# Patient Record
Sex: Female | Born: 1950 | Race: Black or African American | Hispanic: No | Smoking: Current every day smoker
Health system: Southern US, Community
[De-identification: ages and names within clinical notes are randomized; demographics above are authoritative.]

## PROBLEM LIST (undated history)

## (undated) DIAGNOSIS — Z720 Tobacco use: Secondary | ICD-10-CM

## (undated) DIAGNOSIS — I10 Essential (primary) hypertension: Secondary | ICD-10-CM

## (undated) DIAGNOSIS — F329 Major depressive disorder, single episode, unspecified: Secondary | ICD-10-CM

## (undated) DIAGNOSIS — F32A Depression, unspecified: Secondary | ICD-10-CM

## (undated) DIAGNOSIS — K643 Fourth degree hemorrhoids: Secondary | ICD-10-CM

## (undated) DIAGNOSIS — J449 Chronic obstructive pulmonary disease, unspecified: Secondary | ICD-10-CM

## (undated) DIAGNOSIS — M1990 Unspecified osteoarthritis, unspecified site: Secondary | ICD-10-CM

## (undated) DIAGNOSIS — R0600 Dyspnea, unspecified: Secondary | ICD-10-CM

## (undated) HISTORY — PX: EYE SURGERY: SHX253

## (undated) HISTORY — DX: Fourth degree hemorrhoids: K64.3

## (undated) HISTORY — DX: Tobacco use: Z72.0

## (undated) HISTORY — PX: BREAST SURGERY: SHX581

## (undated) HISTORY — PX: TUBAL LIGATION: SHX77

---

## 1999-01-23 HISTORY — PX: BACK SURGERY: SHX140

## 2013-01-22 HISTORY — PX: HEMORRHOID SURGERY: SHX153

## 2016-01-23 HISTORY — PX: OTHER SURGICAL HISTORY: SHX169

## 2017-03-18 ENCOUNTER — Ambulatory Visit: Payer: Self-pay | Admitting: Surgery

## 2017-03-18 ENCOUNTER — Encounter: Payer: Self-pay | Admitting: Surgery

## 2017-03-18 NOTE — H&P (Signed)
Kirsten Cabrera Documented: 03/18/2017 2:29 PM Location: [facility] Patient #: [id number] DOB: 08/22/1950 Widowed / Language: Cleophus Molt / Race: Black or African American Female   History of Present Illness Kirsten Hector MD; 03/18/2017 3:04 PM) The patient is a 67 year old female who presents with hemorrhoids. Note for "Hemorrhoids": ` ` ` Patient sent for surgical consultation at the request of Dr. Harlan Stains  Chief Complaint: Worsening hemorrhoids.  The patient is a woman that has had hemorrhoid surgery in the past is occasionally struggled with anal pain presumably hemorrhoids. Relocated from [facility]. Has struggled with some hemorrhoid issues for the past decade. Recalls having some type of hemorrhoid surgery in 2013. She notes that she gets intermittent bleeding and feels a lump around her anus when she wipes. Has been trying to manage it with baking soda soaks. No sharp pains but irritating when she wipes. Annoying and frustrating. She wish to have something done. Surgical consultation requested. She usually moves her bowels twice a day. Some mild lactose intolerance. She's never had a colonoscopy. She claims her fecal occult bloods were normal back in [place]. She smokes a few cigarettes a day but not heavy smoker. She had her tubes tied but no other abdominal surgeries. She does get short of breath after walking about 15-20 minutes but no exertional chest pain. No history of prior heart attack or stroke.  No personal nor family history of GI/colon cancer, inflammatory bowel disease, irritable bowel syndrome, allergy such as Celiac Sprue, dietary/dairy problems, colitis, ulcers nor gastritis. No recent sick contacts/gastroenteritis. No travel outside the country. No changes in diet. No dysphagia to solids or liquids. No significant heartburn or reflux. No hematochezia, hematemesis, coffee ground emesis. No evidence of prior gastric/peptic  ulceration.  (Review of systems as stated in this history (HPI) or in the review of systems. Otherwise all other 12 point ROS are negative)   Past Surgical History Levonne Spiller, [place]; 03/18/2017 2:30 PM) Breast Biopsy  Right. multiple Hemorrhoidectomy  Spinal Surgery - Lower Back   Diagnostic Studies History Levonne Spiller, CMA; 03/18/2017 2:30 PM) Colonoscopy  never Mammogram  within last year Pap Smear  1-5 years ago  Allergies Levonne Spiller, [place]; 03/18/2017 2:30 PM) Penicillins  Allergies Reconciled   Medication History Levonne Spiller, CMA; 03/18/2017 2:33 PM) Albuterol Sulfate ((2.5 MG/3ML)0.083% Nebulized Soln, Inhalation) Active. AmLODIPine Besylate (5MG  Tablet, Oral) Active. Azopt (1% Suspension, Ophthalmic) Active. Hydrocodone-Acetaminophen (Oral) Specific strength unknown - Active. Hydroxychloroquine Sulfate (200MG  Tablet, Oral) Active. Symbicort (160-4.5MCG/ACT Aerosol, Inhalation) Active. Triamcinolone Acetonide (0.1% Cream, External) Active. Tylenol Extra Strength (500MG  Tablet, Oral) Active. Vitamin B-12 (1000MCG Tablet, Oral) Active. Medications Reconciled  Social History Andee Poles Education officer, museum, CMA; 03/18/2017 2:30 PM) Alcohol use  Occasional alcohol use. Caffeine use  Tea. No drug use  Tobacco use  Current some day smoker.  Family History Levonne Spiller, [place]; 03/18/2017 2:30 PM) Cerebrovascular Accident  Sister. Kidney Disease  Daughter.  Pregnancy / Birth History Levonne Spiller, [place]; 03/18/2017 2:30 PM) Age at menarche  50 years. Age of menopause  50-55 Contraceptive History  Oral contraceptives. Gravida  3 Para  3  Other Problems Levonne Spiller, CMA; 03/18/2017 2:30 PM) Arthritis  Back Pain  Chronic Obstructive Lung Disease  Hemorrhoids  High blood pressure     Review of Systems (Danielle Gerrigner CMA; 03/18/2017 2:30 PM) General Present- Night Sweats and Weight Gain. Not Present-  Appetite Loss, Chills, Fatigue, Fever and Weight Loss. Skin Present- Dryness. Not Present- Change in Wart/Mole, Hives, Jaundice,  New Lesions, Non-Healing Wounds, Rash and Ulcer. HEENT Present- Nose Bleed and Wears glasses/contact lenses. Not Present- Earache, Hearing Loss, Hoarseness, Oral Ulcers, Ringing in the Ears, Seasonal Allergies, Sinus Pain, Sore Throat, Visual Disturbances and Yellow Eyes. Breast Not Present- Breast Mass, Breast Pain, Nipple Discharge and Skin Changes. Cardiovascular Present- Leg Cramps, Shortness of Breath and Swelling of Extremities. Not Present- Chest Pain, Difficulty Breathing Lying Down, Palpitations and Rapid Heart Rate. Gastrointestinal Present- Bloating and Hemorrhoids. Not Present- Abdominal Pain, Bloody Stool, Change in Bowel Habits, Chronic diarrhea, Constipation, Difficulty Swallowing, Excessive gas, Gets full quickly at meals, Indigestion, Nausea, Rectal Pain and Vomiting. Musculoskeletal Present- Back Pain and Muscle Weakness. Not Present- Joint Pain, Joint Stiffness, Muscle Pain and Swelling of Extremities. Psychiatric Present- Change in Sleep Pattern. Not Present- Anxiety, Bipolar, Depression, Fearful and Frequent crying. Endocrine Present- Hair Changes and Hot flashes. Not Present- Cold Intolerance, Excessive Hunger, Heat Intolerance and New Diabetes. Hematology Not Present- Blood Thinners, Easy Bruising, Excessive bleeding, Gland problems, HIV and Persistent Infections.  Vitals Andee Poles Gerrigner CMA; 03/18/2017 2:33 PM) 03/18/2017 2:33 PM Weight: 184 lb Height: 62in Body Surface Area: 1.84 m Body Mass Index: 33.65 kg/m  Temp.: 98.62F(Oral)  Pulse: 103 (Regular)  BP: 162/90 (Sitting, Right Arm, Standard)       Physical Exam Kirsten Hector MD; 03/18/2017 2:46 PM) General Mental Status-Alert. General Appearance-Not in acute distress, Not Sickly. Orientation-Oriented X3. Hydration-Well  hydrated. Voice-Normal.  Integumentary Global Assessment Upon inspection and palpation of skin surfaces of the - Axillae: non-tender, no inflammation or ulceration, no drainage. and Distribution of scalp and body hair is normal. General Characteristics Temperature - normal warmth is noted.  Head and Neck Head-normocephalic, atraumatic with no lesions or palpable masses. Face Global Assessment - atraumatic, no absence of expression. Neck Global Assessment - no abnormal movements, no bruit auscultated on the right, no bruit auscultated on the left, no decreased range of motion, non-tender. Trachea-midline. Thyroid Gland Characteristics - non-tender.  Eye Eyeball - Left-Extraocular movements intact, No Nystagmus. Eyeball - Right-Extraocular movements intact, No Nystagmus. Cornea - Left-No Hazy. Cornea - Right-No Hazy. Sclera/Conjunctiva - Left-No scleral icterus, No Discharge. Sclera/Conjunctiva - Right-No scleral icterus, No Discharge. Pupil - Left-Direct reaction to light normal. Pupil - Right-Direct reaction to light normal.  ENMT Ears Pinna - Left - no drainage observed, no generalized tenderness observed. Right - no drainage observed, no generalized tenderness observed. Nose and Sinuses External Inspection of the Nose - no destructive lesion observed. Inspection of the nares - Left - quiet respiration. Right - quiet respiration. Mouth and Throat Lips - Upper Lip - no fissures observed, no pallor noted. Lower Lip - no fissures observed, no pallor noted. Nasopharynx - no discharge present. Oral Cavity/Oropharynx - Tongue - no dryness observed. Oral Mucosa - no cyanosis observed. Hypopharynx - no evidence of airway distress observed.  Chest and Lung Exam Inspection Movements - Normal and Symmetrical. Accessory muscles - No use of accessory muscles in breathing. Palpation Palpation of the chest reveals - Non-tender. Auscultation Breath sounds - Normal  and Clear.  Cardiovascular Auscultation Rhythm - Regular. Murmurs & Other Heart Sounds - Auscultation of the heart reveals - No Murmurs and No Systolic Clicks.  Abdomen Inspection Inspection of the abdomen reveals - No Visible peristalsis and No Abnormal pulsations. Umbilicus - No Bleeding, No Urine drainage. Palpation/Percussion Palpation and Percussion of the abdomen reveal - Soft, Non Tender, No Rebound tenderness, No Rigidity (guarding) and No Cutaneous hyperesthesia. Note: Abdomen soft. Not severely distended. No  distasis recti. No umbilical or other anterior abdominal wall hernias   Female Genitourinary Sexual Maturity Tanner 5 - Adult hair pattern. Note: No vaginal bleeding nor discharge   Rectal Note: ` ` ` Please refer to anoscopy section. Left posterior/lateral chronically prolapsed Grade 4 hemorrhoid. Sensitive.   Peripheral Vascular Upper Extremity Inspection - Left - No Cyanotic nailbeds, Not Ischemic. Right - No Cyanotic nailbeds, Not Ischemic.  Neurologic Neurologic evaluation reveals -normal attention span and ability to concentrate, able to name objects and repeat phrases. Appropriate fund of knowledge , normal sensation and normal coordination. Mental Status Affect - not angry, not paranoid. Cranial Nerves-Normal Bilaterally. Gait-Normal.  Neuropsychiatric Mental status exam performed with findings of-able to articulate well with normal speech/language, rate, volume and coherence, thought content normal with ability to perform basic computations and apply abstract reasoning and no evidence of hallucinations, delusions, obsessions or homicidal/suicidal ideation. Note: Fair recollection but no obvious dementia   Musculoskeletal Global Assessment Spine, Ribs and Pelvis - no instability, subluxation or laxity. Right Upper Extremity - no instability, subluxation or laxity.  Lymphatic Head & Neck  General Head & Neck Lymphatics: Bilateral -  Description - No Localized lymphadenopathy. Axillary  General Axillary Region: Bilateral - Description - No Localized lymphadenopathy. Femoral & Inguinal  Generalized Femoral & Inguinal Lymphatics: Left - Description - No Localized lymphadenopathy. Right - Description - No Localized lymphadenopathy.   Results Kirsten Hector MD; 03/18/2017 3:04 PM) Procedures  Name Value Date Hemorrhoids Procedure Anal exam: prolapse Internal exam: Internal Hemorroids ( non-bleeding) prolapse Other: Left prolapsed mucosal mass consistent with grade 4 left posterior lateral hemorrhoid. Sensitive. No thrombosis. No definite fissure or fistula. Anoscopy confirms grade 2 internal hemorrhoids............Marland KitchenPerianal skin clean with chronic moisture but no poor hygiene. No pruritis ani. No pilonidal disease. No fissure. No abscess/fistula. Normal sphincter tone. ....Marland KitchenMarland Kitcheno condyloma warts. Sensitive but tolerates digital and anoscopic rectal exam. No rectal masses.  Performed: 03/18/2017 2:51 PM    Assessment & Plan Kirsten Hector MD; 03/18/2017 2:51 PM) PROLAPSED INTERNAL HEMORRHOIDS, GRADE 4 (K64.3) Impression: Chronically prolapsed left posterolateral hemorrhoid. Sensitive. Chronic irritation. Doubt fistula with hypertrophy.  This will not go away without surgery. She wishes to consider this but definitely wants to be under general anesthesia. I don't blame her.  The anatomy & physiology of the anorectal region was discussed. The pathophysiology of hemorrhoids and differential diagnosis was discussed. Natural history progression was discussed. I stressed the importance of a bowel regimen to have daily soft bowel movements to minimize progression of disease. Goal of one BM / day ideal. Use of wet wipes, warm baths, avoiding straining, etc were emphasized.  Educational handouts further explaining the pathology, treatment options, and bowel regimen were given as well. The patient  expressed understanding. Current Plans ANOSCOPY, DIAGNOSTIC (70962) Pt Education - Pamphlet Given - The Hemorrhoid Book: discussed with patient and provided information. The anatomy & physiology of the anorectal region was discussed. The pathophysiology of hemorrhoids and differential diagnosis was discussed. Natural history risks without surgery was discussed. I stressed the importance of a bowel regimen to have daily soft bowel movements to minimize progression of disease. Interventions such as sclerotherapy & banding were discussed.  The patient's symptoms are not adequately controlled by medicines and other non-operative treatments. I feel the risks & problems of no surgery outweigh the operative risks; therefore, I recommended surgery to treat the hemorrhoids by ligation, pexy, and possible resection.  Risks such as bleeding, infection, urinary difficulties, need for further treatment, heart  attack, death, and other risks were discussed. I noted a good likelihood this will help address the problem. Goals of post-operative recovery were discussed as well. Possibility that this will not correct all symptoms was explained. Post-operative pain, bleeding, constipation, and other problems after surgery were discussed. We will work to minimize complications. Educational handouts further explaining the pathology, treatment options, and bowel regimen were given as well. Questions were answered. The patient expresses understanding & wishes to proceed with surgery.  Pt Education - [facility] Hemorrhoids (Mahagony Grieb): discussed with patient and provided information. PROLAPSED INTERNAL HEMORRHOIDS, GRADE 2 (K64.1) ENCOUNTER FOR PREOPERATIVE EXAMINATION FOR GENERAL SURGICAL PROCEDURE (Z01.818) Current Plans You are being scheduled for surgery- Our schedulers will call you.  You should hear from our office's scheduling department within 5 working days about the location, date, and time of surgery. We  try to make accommodations for patient's preferences in scheduling surgery, but sometimes the OR schedule or the surgeon's schedule prevents [place] from making those accommodations.  If you have not heard from our office [phone]) in 5 working days, call the office and ask for your surgeon's nurse.  If you have other questions about your diagnosis, plan, or surgery, call the office and ask for your surgeon's nurse.  Pt Education - [facility] Rectal Prep for Anorectal outpatient/office surgery: discussed with patient and provided information. Pt Education - [facility] Rectal Surgery HCI (Zhane Donlan): discussed with patient and provided information.   Signed by Kirsten Hector, MD (03/18/2017 3:07 PM)  Kirsten Cabrera, M.D., F.A.C.S. Gastrointestinal and Minimally Invasive Surgery [facility] [address], [place], [place] [address] [phone] Main / Paging

## 2017-04-12 NOTE — Patient Instructions (Addendum)
Kirsten Cabrera  04/12/2017   Your procedure is scheduled on: Friday 04/19/2017  Report to [facility] Main  Entrance              Report to admitting at   [place]  AM    Call this number if you have problems the morning of surgery [phone]               Follow Bowel prep instructions from Dr. Johney Maine office !               NO SOLID FOOD AFTER MIDNIGHT THE NIGHT PRIOR TO SURGERY. NOTHING BY MOUTH EXCEPT CLEAR LIQUIDS UNTIL 3 HOURS PRIOR TO [place] SURGERY. PLEASE FINISH ENSURE DRINK PER             SURGEON ORDER 3 HOURS PRIOR TO SCHEDULED SURGERY TIME WHICH NEEDS TO BE COMPLETED AT  0815 am.    CLEAR LIQUID DIET   Foods Allowed                                                                     Foods Excluded  Coffee and tea, regular and decaf                             liquids that you cannot  Plain Jell-O in any flavor                                             see through such as: Fruit ices (not with fruit pulp)                                     milk, soups, orange juice  Iced Popsicles                                    All solid food Carbonated beverages, regular and diet                                    Cranberry, grape and apple juices Sports drinks like Gatorade Lightly seasoned clear broth or consume(fat free) Sugar, honey syrup  Sample Menu Breakfast                                Lunch                                     Supper Cranberry juice                    Beef broth  Chicken broth Jell-O                                     Grape juice                           Apple juice Coffee or tea                        Jell-O                                      Popsicle                                                Coffee or tea                        Coffee or tea  _____________________________________________________________________      Remember: Do not eat food or drink liquids :After  Midnight.     Take these medicines the morning of surgery with A SIP OF WATER: Amlodipine (Norvasc), Plaquenil, use Albuterol inhaler if needed , use Albuterol nebulizer if needed, use Symbicort inhaler and bring all inhalers with you to the hospital.                                You may not have any metal on your body including hair pins and              piercings  Do not wear jewelry, make-up, lotions, powders or perfumes, deodorant             Do not wear nail polish.  Do not shave  48 hours prior to surgery.              Men may shave face and neck.   Do not bring valuables to the hospital. [place].  Contacts, dentures or bridgework may not be worn into surgery.  Leave suitcase in the car. After surgery it may be brought to your room.     Patients discharged the day of surgery will not be allowed to drive home.  Name and phone number of your driver:  Special Instructions: N/A              Please read over the following fact sheets you were given: _____________________________________________________________________             [facility] - Preparing for Surgery Before surgery, you can play an important role.  Because skin is not sterile, your skin needs to be as free of germs as possible.  You can reduce the number of germs on your skin by washing with CHG (chlorahexidine gluconate) soap before surgery.  CHG is an antiseptic cleaner which kills germs and bonds with the skin to continue killing germs even after washing. Please DO NOT use if you have an allergy to CHG or antibacterial soaps.  If your skin becomes reddened/irritated stop using  the CHG and inform your nurse when you arrive at Short Stay. Do not shave (including legs and underarms) for at least 48 hours prior to the first CHG shower.  You may shave your face/neck. Please follow these instructions carefully:  1.  Shower with CHG Soap the night before surgery and the   morning of Surgery.  2.  If you choose to wash your hair, wash your hair first as usual with your  normal  shampoo.  3.  After you shampoo, rinse your hair and body thoroughly to remove the  shampoo.                           4.  Use CHG as you would any other liquid soap.  You can apply chg directly  to the skin and wash                       Gently with a scrungie or clean washcloth.  5.  Apply the CHG Soap to your body ONLY FROM THE NECK DOWN.   Do not use on face/ open                           Wound or open sores. Avoid contact with eyes, ears mouth and genitals (private parts).                       Wash face,  Genitals (private parts) with your normal soap.             6.  Wash thoroughly, paying special attention to the area where your surgery  will be performed.  7.  Thoroughly rinse your body with warm water from the neck down.  8.  DO NOT shower/wash with your normal soap after using and rinsing off  the CHG Soap.                9.  Pat yourself dry with a clean towel.            10.  Wear clean pajamas.            11.  Place clean sheets on your bed the night of your first shower and do not  sleep with pets. Day of Surgery : Do not apply any lotions/deodorants the morning of surgery.  Please wear clean clothes to the hospital/surgery center.  FAILURE TO FOLLOW THESE INSTRUCTIONS MAY RESULT IN THE CANCELLATION OF YOUR SURGERY PATIENT SIGNATURE_________________________________  NURSE SIGNATURE__________________________________

## 2017-04-15 ENCOUNTER — Encounter (HOSPITAL_COMMUNITY): Payer: Self-pay

## 2017-04-15 ENCOUNTER — Encounter (HOSPITAL_COMMUNITY)
Admission: RE | Admit: 2017-04-15 | Discharge: 2017-04-15 | Disposition: A | Discharge status: Home / Self Care | Payer: Medicare Other | Source: Ambulatory Visit | Attending: Surgery | Admitting: Surgery

## 2017-04-15 ENCOUNTER — Other Ambulatory Visit: Payer: Self-pay

## 2017-04-15 DIAGNOSIS — K648 Other hemorrhoids: Secondary | ICD-10-CM | POA: Insufficient documentation

## 2017-04-15 DIAGNOSIS — I493 Ventricular premature depolarization: Secondary | ICD-10-CM | POA: Insufficient documentation

## 2017-04-15 DIAGNOSIS — I10 Essential (primary) hypertension: Secondary | ICD-10-CM | POA: Insufficient documentation

## 2017-04-15 DIAGNOSIS — Z01818 Encounter for other preprocedural examination: Secondary | ICD-10-CM | POA: Diagnosis not present

## 2017-04-15 HISTORY — DX: Essential (primary) hypertension: I10

## 2017-04-15 HISTORY — DX: Dyspnea, unspecified: R06.00

## 2017-04-15 HISTORY — DX: Chronic obstructive pulmonary disease, unspecified: J44.9

## 2017-04-15 HISTORY — DX: Unspecified osteoarthritis, unspecified site: M19.90

## 2017-04-15 HISTORY — DX: Depression, unspecified: F32.A

## 2017-04-15 HISTORY — DX: Major depressive disorder, single episode, unspecified: F32.9

## 2017-04-15 LAB — CBC
HCT: 36.9 % (ref 36.0–46.0)
HEMOGLOBIN: 12.1 g/dL (ref 12.0–15.0)
MCH: 32.4 pg (ref 26.0–34.0)
MCHC: 32.8 g/dL (ref 30.0–36.0)
MCV: 98.9 fL (ref 78.0–100.0)
Platelets: 289 10*3/uL (ref 150–400)
RBC: 3.73 MIL/uL — AB (ref 3.87–5.11)
RDW: 15 % (ref 11.5–15.5)
WBC: 5.1 10*3/uL (ref 4.0–10.5)

## 2017-04-15 LAB — BASIC METABOLIC PANEL
Anion gap: 9 (ref 5–15)
BUN: 15 mg/dL (ref 6–20)
CHLORIDE: 107 mmol/L (ref 101–111)
CO2: 26 mmol/L (ref 22–32)
Calcium: 9.3 mg/dL (ref 8.9–10.3)
Creatinine, Ser: 1.16 mg/dL — ABNORMAL HIGH (ref 0.44–1.00)
GFR calc Af Amer: 55 mL/min — ABNORMAL LOW (ref >60)
GFR calc non Af Amer: 48 mL/min — ABNORMAL LOW (ref >60)
Glucose, Bld: 76 mg/dL (ref 65–99)
Potassium: 3.4 mmol/L — ABNORMAL LOW (ref 3.5–5.1)
SODIUM: 142 mmol/L (ref 135–145)

## 2017-04-16 NOTE — Progress Notes (Signed)
Consulted Dr. Nolon Nations, [facility] face to face about EKG results from 04/15/2017. Per Dr. Lissa Hoard, [facility], patient OK for surgery

## 2017-04-18 MED ORDER — BUPIVACAINE LIPOSOME 1.3 % IJ SUSP
20.0000 mL | INTRAMUSCULAR | Status: DC
  Filled 2017-04-18: qty 20

## 2017-04-19 ENCOUNTER — Ambulatory Visit (HOSPITAL_COMMUNITY)
Admission: RE | Admit: 2017-04-19 | Discharge: 2017-04-19 | Disposition: A | Discharge status: Home / Self Care | Payer: Medicare Other | Source: Ambulatory Visit | Attending: Surgery | Admitting: Surgery

## 2017-04-19 ENCOUNTER — Encounter (HOSPITAL_COMMUNITY)
Admission: RE | Disposition: A | Discharge status: Home / Self Care | Payer: Self-pay | Source: Ambulatory Visit | Attending: Surgery

## 2017-04-19 ENCOUNTER — Ambulatory Visit (HOSPITAL_COMMUNITY): Payer: Medicare Other

## 2017-04-19 ENCOUNTER — Encounter (HOSPITAL_COMMUNITY): Payer: Self-pay | Admitting: Anesthesiology

## 2017-04-19 DIAGNOSIS — F17210 Nicotine dependence, cigarettes, uncomplicated: Secondary | ICD-10-CM | POA: Insufficient documentation

## 2017-04-19 DIAGNOSIS — K644 Residual hemorrhoidal skin tags: Secondary | ICD-10-CM | POA: Diagnosis not present

## 2017-04-19 DIAGNOSIS — K643 Fourth degree hemorrhoids: Secondary | ICD-10-CM | POA: Diagnosis present

## 2017-04-19 DIAGNOSIS — J449 Chronic obstructive pulmonary disease, unspecified: Secondary | ICD-10-CM | POA: Diagnosis not present

## 2017-04-19 DIAGNOSIS — Z79899 Other long term (current) drug therapy: Secondary | ICD-10-CM | POA: Diagnosis not present

## 2017-04-19 DIAGNOSIS — Z6832 Body mass index (BMI) 32.0-32.9, adult: Secondary | ICD-10-CM | POA: Insufficient documentation

## 2017-04-19 DIAGNOSIS — K6289 Other specified diseases of anus and rectum: Secondary | ICD-10-CM

## 2017-04-19 DIAGNOSIS — Z7951 Long term (current) use of inhaled steroids: Secondary | ICD-10-CM | POA: Diagnosis not present

## 2017-04-19 DIAGNOSIS — I10 Essential (primary) hypertension: Secondary | ICD-10-CM | POA: Insufficient documentation

## 2017-04-19 DIAGNOSIS — Z880 Allergy status to penicillin: Secondary | ICD-10-CM | POA: Diagnosis not present

## 2017-04-19 DIAGNOSIS — Z882 Allergy status to sulfonamides status: Secondary | ICD-10-CM | POA: Diagnosis not present

## 2017-04-19 DIAGNOSIS — M1990 Unspecified osteoarthritis, unspecified site: Secondary | ICD-10-CM | POA: Diagnosis not present

## 2017-04-19 HISTORY — PX: HEMORRHOID SURGERY: SHX153

## 2017-04-19 SURGERY — HEMORRHOIDECTOMY
Anesthesia: General | Site: Rectum

## 2017-04-19 MED ORDER — DEXAMETHASONE SODIUM PHOSPHATE 10 MG/ML IJ SOLN
INTRAMUSCULAR | Status: DC | PRN
  Administered 2017-04-19: 10 mg via INTRAVENOUS

## 2017-04-19 MED ORDER — METRONIDAZOLE IN NACL 5-0.79 MG/ML-% IV SOLN
500.0000 mg | Freq: Once | INTRAVENOUS | Status: AC
  Administered 2017-04-19: 500 mg via INTRAVENOUS
  Filled 2017-04-19 (×2): qty 100

## 2017-04-19 MED ORDER — LACTATED RINGERS IV SOLN
INTRAVENOUS | Status: DC
  Administered 2017-04-19: 1000 mL via INTRAVENOUS
  Administered 2017-04-19: 11:00:00 via INTRAVENOUS

## 2017-04-19 MED ORDER — GABAPENTIN 300 MG PO CAPS
300.0000 mg | ORAL_CAPSULE | ORAL | Status: AC
  Administered 2017-04-19: 300 mg via ORAL
  Filled 2017-04-19: qty 1

## 2017-04-19 MED ORDER — 0.9 % SODIUM CHLORIDE (POUR BTL) OPTIME
TOPICAL | Status: DC | PRN
  Administered 2017-04-19: 1000 mL

## 2017-04-19 MED ORDER — PROMETHAZINE HCL 25 MG/ML IJ SOLN: 6.2500 mg | INTRAMUSCULAR | Status: DC | PRN

## 2017-04-19 MED ORDER — MIDAZOLAM HCL 5 MG/5ML IJ SOLN
INTRAMUSCULAR | Status: DC | PRN
  Administered 2017-04-19: 1 mg via INTRAVENOUS

## 2017-04-19 MED ORDER — ROCURONIUM BROMIDE 10 MG/ML (PF) SYRINGE
PREFILLED_SYRINGE | INTRAVENOUS | Status: DC | PRN
Start: 2017-04-19 — End: 2017-04-19
  Administered 2017-04-19: 40 mg via INTRAVENOUS

## 2017-04-19 MED ORDER — DEXAMETHASONE SODIUM PHOSPHATE 10 MG/ML IJ SOLN
INTRAMUSCULAR | Status: AC
  Filled 2017-04-19: qty 1

## 2017-04-19 MED ORDER — ONDANSETRON HCL 4 MG/2ML IJ SOLN
INTRAMUSCULAR | Status: DC | PRN
  Administered 2017-04-19: 4 mg via INTRAVENOUS

## 2017-04-19 MED ORDER — PROPOFOL 10 MG/ML IV BOLUS
INTRAVENOUS | Status: AC
  Filled 2017-04-19: qty 20

## 2017-04-19 MED ORDER — FENTANYL CITRATE (PF) 100 MCG/2ML IJ SOLN
INTRAMUSCULAR | Status: AC
  Filled 2017-04-19: qty 2

## 2017-04-19 MED ORDER — BUPIVACAINE-EPINEPHRINE 0.5% -1:200000 IJ SOLN
INTRAMUSCULAR | Status: DC | PRN
  Administered 2017-04-19: 20 mL

## 2017-04-19 MED ORDER — BUPIVACAINE LIPOSOME 1.3 % IJ SUSP
INTRAMUSCULAR | Status: DC | PRN
Start: 2017-04-19 — End: 2017-04-19
  Administered 2017-04-19: 20 mL

## 2017-04-19 MED ORDER — LIDOCAINE 2% (20 MG/ML) 5 ML SYRINGE
INTRAMUSCULAR | Status: DC | PRN
  Administered 2017-04-19: 80 mg via INTRAVENOUS

## 2017-04-19 MED ORDER — CHLORHEXIDINE GLUCONATE CLOTH 2 % EX PADS: 6.0000 | MEDICATED_PAD | Freq: Once | CUTANEOUS | Status: DC

## 2017-04-19 MED ORDER — MEPERIDINE HCL 50 MG/ML IJ SOLN: 6.2500 mg | INTRAMUSCULAR | Status: DC | PRN

## 2017-04-19 MED ORDER — FENTANYL CITRATE (PF) 100 MCG/2ML IJ SOLN
INTRAMUSCULAR | Status: DC | PRN
  Administered 2017-04-19 (×2): 50 ug via INTRAVENOUS

## 2017-04-19 MED ORDER — SUGAMMADEX SODIUM 200 MG/2ML IV SOLN
INTRAVENOUS | Status: AC
  Filled 2017-04-19: qty 2

## 2017-04-19 MED ORDER — AMBULATORY NON FORMULARY MEDICATION: 1.0000 "application " | Freq: Four times a day (QID) | 2 refills | Status: DC

## 2017-04-19 MED ORDER — OXYCODONE HCL 5 MG PO TABS: 5.0000 mg | ORAL_TABLET | Freq: Four times a day (QID) | ORAL | 0 refills | Status: DC | PRN

## 2017-04-19 MED ORDER — MIDAZOLAM HCL 2 MG/2ML IJ SOLN: 0.5000 mg | Freq: Once | INTRAMUSCULAR | Status: DC | PRN

## 2017-04-19 MED ORDER — DIBUCAINE 1 % RE OINT
TOPICAL_OINTMENT | RECTAL | Status: AC
  Filled 2017-04-19: qty 28

## 2017-04-19 MED ORDER — EPHEDRINE SULFATE-NACL 50-0.9 MG/10ML-% IV SOSY
PREFILLED_SYRINGE | INTRAVENOUS | Status: DC | PRN
  Administered 2017-04-19 (×3): 5 mg via INTRAVENOUS

## 2017-04-19 MED ORDER — PROPOFOL 10 MG/ML IV BOLUS
INTRAVENOUS | Status: DC | PRN
  Administered 2017-04-19: 110 mg via INTRAVENOUS

## 2017-04-19 MED ORDER — ACETAMINOPHEN 500 MG PO TABS
1000.0000 mg | ORAL_TABLET | ORAL | Status: AC
  Administered 2017-04-19: 1000 mg via ORAL
  Filled 2017-04-19: qty 2

## 2017-04-19 MED ORDER — ONDANSETRON HCL 4 MG/2ML IJ SOLN
INTRAMUSCULAR | Status: AC
  Filled 2017-04-19: qty 2

## 2017-04-19 MED ORDER — DEXAMETHASONE SODIUM PHOSPHATE 10 MG/ML IJ SOLN
INTRAMUSCULAR | Status: AC
  Filled 2017-04-19: qty 3

## 2017-04-19 MED ORDER — ROCURONIUM BROMIDE 10 MG/ML (PF) SYRINGE
PREFILLED_SYRINGE | INTRAVENOUS | Status: AC
  Filled 2017-04-19: qty 5

## 2017-04-19 MED ORDER — DEXAMETHASONE SODIUM PHOSPHATE 4 MG/ML IJ SOLN: 4.0000 mg | INTRAMUSCULAR | Status: DC

## 2017-04-19 MED ORDER — VANCOMYCIN HCL IN DEXTROSE 1-5 GM/200ML-% IV SOLN
1000.0000 mg | INTRAVENOUS | Status: AC
  Administered 2017-04-19: 1000 mg via INTRAVENOUS
  Filled 2017-04-19: qty 200

## 2017-04-19 MED ORDER — FENTANYL CITRATE (PF) 100 MCG/2ML IJ SOLN: 25.0000 ug | INTRAMUSCULAR | Status: DC | PRN

## 2017-04-19 MED ORDER — DIBUCAINE 1 % RE OINT
TOPICAL_OINTMENT | RECTAL | Status: DC | PRN
  Administered 2017-04-19: 1 via RECTAL

## 2017-04-19 MED ORDER — MIDAZOLAM HCL 2 MG/2ML IJ SOLN
INTRAMUSCULAR | Status: AC
  Filled 2017-04-19: qty 2

## 2017-04-19 MED ORDER — SUGAMMADEX SODIUM 200 MG/2ML IV SOLN
INTRAVENOUS | Status: DC | PRN
  Administered 2017-04-19: 200 mg via INTRAVENOUS

## 2017-04-19 MED ORDER — ENSURE PRE-SURGERY PO LIQD
296.0000 mL | Freq: Once | ORAL | Status: DC
  Filled 2017-04-19: qty 296

## 2017-04-19 MED ORDER — EPHEDRINE 5 MG/ML INJ
INTRAVENOUS | Status: AC
  Filled 2017-04-19: qty 10

## 2017-04-19 MED ORDER — LIDOCAINE 2% (20 MG/ML) 5 ML SYRINGE
INTRAMUSCULAR | Status: AC
  Filled 2017-04-19: qty 5

## 2017-04-19 SURGICAL SUPPLY — 34 items
BENZOIN TINCTURE PRP APPL 2/3 (GAUZE/BANDAGES/DRESSINGS) ×4 IMPLANT
BLADE SURG 15 STRL LF DISP TIS (BLADE) ×2 IMPLANT
BLADE SURG 15 STRL SS (BLADE) ×2
BRIEF STRETCH FOR OB PAD LRG (UNDERPADS AND DIAPERS) ×4 IMPLANT
CONT SPEC 4OZ CLIKSEAL STRL BL (MISCELLANEOUS) ×4 IMPLANT
COVER SURGICAL LIGHT HANDLE (MISCELLANEOUS) ×4 IMPLANT
DECANTER SPIKE VIAL GLASS SM (MISCELLANEOUS) ×4 IMPLANT
DRAPE LAPAROTOMY T 102X78X121 (DRAPES) ×4 IMPLANT
DRSG PAD ABDOMINAL 8X10 ST (GAUZE/BANDAGES/DRESSINGS) IMPLANT
ELECT PENCIL ROCKER SW 15FT (MISCELLANEOUS) ×4 IMPLANT
ELECT REM PT RETURN 15FT ADLT (MISCELLANEOUS) ×4 IMPLANT
GAUZE SPONGE 4X4 12PLY STRL (GAUZE/BANDAGES/DRESSINGS) ×4 IMPLANT
GAUZE SPONGE 4X4 16PLY XRAY LF (GAUZE/BANDAGES/DRESSINGS) ×4 IMPLANT
GLOVE ECLIPSE 8.0 STRL XLNG CF (GLOVE) ×4 IMPLANT
GLOVE INDICATOR 8.0 STRL GRN (GLOVE) ×4 IMPLANT
GOWN STRL REUS W/TWL XL LVL3 (GOWN DISPOSABLE) ×8 IMPLANT
KIT BASIN OR (CUSTOM PROCEDURE TRAY) ×4 IMPLANT
LOOP VESSEL MAXI BLUE (MISCELLANEOUS) IMPLANT
LUBRICANT JELLY K Y 4OZ (MISCELLANEOUS) ×4 IMPLANT
NEEDLE HYPO 22GX1.5 SAFETY (NEEDLE) ×4 IMPLANT
PACK BASIC VI WITH GOWN DISP (CUSTOM PROCEDURE TRAY) ×4 IMPLANT
PAD ABD 8X10 STRL (GAUZE/BANDAGES/DRESSINGS) ×4 IMPLANT
SCRUB TECHNI CARE 4 OZ NO DYE (MISCELLANEOUS) ×4 IMPLANT
SHEARS HARMONIC 9CM CVD (BLADE) IMPLANT
SUT CHROMIC 2 0 SH (SUTURE) ×4 IMPLANT
SUT CHROMIC 3 0 SH 27 (SUTURE) IMPLANT
SUT VIC AB 2-0 SH 27 (SUTURE) ×2
SUT VIC AB 2-0 SH 27X BRD (SUTURE) ×2 IMPLANT
SUT VIC AB 2-0 UR6 27 (SUTURE) ×24 IMPLANT
SYR 20CC LL (SYRINGE) ×4 IMPLANT
SYR 3ML LL SCALE MARK (SYRINGE) ×4 IMPLANT
TOWEL OR 17X26 10 PK STRL BLUE (TOWEL DISPOSABLE) ×4 IMPLANT
TOWEL OR NON WOVEN STRL DISP B (DISPOSABLE) ×4 IMPLANT
YANKAUER SUCT BULB TIP 10FT TU (MISCELLANEOUS) ×4 IMPLANT

## 2017-04-19 NOTE — Interval H&P Note (Signed)
History and Physical Interval Note:  04/19/2017 9:47 AM  Kirsten Cabrera  has presented today for surgery, with the diagnosis of Hemorrhoids grade 4 prolapsed with bleeding  The various methods of treatment have been discussed with the patient and family. After consideration of risks, benefits and other options for treatment, the patient has consented to  Procedure(s): HEMORRHOIDECTOMY WITH HEMORRHOIDAL LIGATION/PEXY (N/A) ANORECTAL EXAM UNDER ANESTHESIA (N/A) as a surgical intervention .  The patient's history has been reviewed, patient examined, no change in status, stable for surgery.  I have reviewed the patient's chart and labs.  Questions were answered to the patient's satisfaction.    I have re-reviewed the the patient's records, history, medications, and allergies.  I have re-examined the patient.  I again discussed intraoperative plans and goals of post-operative recovery.  The patient agrees to proceed.  Kirsten Cabrera  1950/08/08 [id number]  Patient Care Team: Harlan Stains, MD as PCP - General (Family Medicine) Michael Boston, MD as Consulting Physician (General Surgery)  There are no active problems to display for this patient.   Past Medical History:  Diagnosis Date  . Arthritis   . COPD (chronic obstructive pulmonary disease) ([place])   . Depression   . Dyspnea    with exertion  . Hypertension   . Prolapsed internal hemorrhoids, grade 4   . Tobacco abuse     Past Surgical History:  Procedure Laterality Date  . BACK SURGERY  2001   lumbra L4-S1  . BREAST SURGERY     right breast biopsy  . clitoris biopsy  2018  . EYE SURGERY     bilateral cataract surgery  . HEMORRHOID SURGERY  2015  . TUBAL LIGATION     age 89    Social History   Socioeconomic History  . Marital status: Widowed    Spouse name: Not on file  . Number of children: Not on file  . Years of education: Not on file  . Highest education level: Not on file  Occupational History  .  Not on file  Social Needs  . Financial resource strain: Not on file  . Food insecurity:    Worry: Not on file    Inability: Not on file  . Transportation needs:    Medical: Not on file    Non-medical: Not on file  Tobacco Use  . Smoking status: Current Every Day Smoker    Packs/day: 0.25    Years: 20.00    Pack years: 5.00    Types: Cigarettes  . Smokeless tobacco: Never Used  Substance and Sexual Activity  . Alcohol use: Yes    Comment: occassionally  . Drug use: Never  . Sexual activity: Not on file  Lifestyle  . Physical activity:    Days per week: Not on file    Minutes per session: Not on file  . Stress: Not on file  Relationships  . Social connections:    Talks on phone: Not on file    Gets together: Not on file    Attends religious service: Not on file    Active member of club or organization: Not on file    Attends meetings of clubs or organizations: Not on file    Relationship status: Not on file  . Intimate partner violence:    Fear of current or ex partner: Not on file    Emotionally abused: Not on file    Physically abused: Not on file    Forced sexual activity: Not on  file  Other Topics Concern  . Not on file  Social History Narrative  . Not on file    History reviewed. No pertinent family history.  Medications Prior to Admission  Medication Sig Dispense Refill Last Dose  . acetaminophen (TYLENOL) 500 MG tablet Take 1,000 mg by mouth every 6 (six) hours as needed for moderate pain or headache.   Past Week at Unknown time  . albuterol (PROVENTIL HFA;VENTOLIN HFA) 108 (90 Base) MCG/ACT inhaler Inhale 2 puffs into the lungs every 4 (four) hours as needed for wheezing or shortness of breath.   04/19/2017 at 0300  . amLODipine (NORVASC) 5 MG tablet Take 5 mg by mouth daily.   04/18/2017 at Unknown time  . brinzolamide (AZOPT) 1 % ophthalmic suspension Place 1 drop into the right eye 2 (two) times daily.   04/19/2017 at 0800  . budesonide-formoterol  (SYMBICORT) 160-4.5 MCG/ACT inhaler Inhale 2 puffs into the lungs 2 (two) times daily.   04/18/2017 at Unknown time  . Cyanocobalamin (VITAMIN B-12) 5000 MCG TBDP Take 5,000 mcg by mouth daily.   Past Week at Unknown time  . doxycycline (VIBRAMYCIN) 100 MG capsule Take 100 mg by mouth 2 (two) times daily. For 10 days, started it on 04/10/2017   04/18/2017 at Unknown time  . hydroxychloroquine (PLAQUENIL) 200 MG tablet Take 200 mg by mouth 2 (two) times daily.   04/18/2017 at Unknown time  . phenylephrine (LITTLE NOSES DECONGESTANT) 0.125 % nasal drops Place 1-2 drops into the nose every 6 (six) hours as needed for congestion.   Past Month at Unknown time  . Probiotic Product (PROBIOTIC PO) Take 1 capsule by mouth daily.   Past Week at Unknown time  . valACYclovir (VALTREX) 500 MG tablet Take 500 mg by mouth daily as needed (for out break).   Past Month at Unknown time  . albuterol (PROVENTIL) (2.5 MG/3ML) 0.083% nebulizer solution Take 2.5 mg by nebulization every 4 (four) hours as needed for wheezing or shortness of breath.   More than a month at Unknown time    Current Facility-Administered Medications  Medication Dose Route Frequency Provider Last Rate Last Dose  . acetaminophen (TYLENOL) tablet 1,000 mg  1,000 mg Oral On Call to OR Michael Boston, MD      . bupivacaine liposome (EXPAREL) 1.3 % injection 266 mg  20 mL Infiltration On Call to OR Michael Boston, MD      . Chlorhexidine Gluconate Cloth 2 % PADS 6 each  6 each Topical Once Michael Boston, MD       And  . Chlorhexidine Gluconate Cloth 2 % PADS 6 each  6 each Topical Once Michael Boston, MD      . dexamethasone (DECADRON) injection 4 mg  4 mg Intravenous On Call to OR Michael Boston, MD      . feeding supplement (ENSURE PRE-SURGERY) liquid 296 mL  296 mL Oral Once Michael Boston, MD      . gabapentin (NEURONTIN) capsule 300 mg  300 mg Oral On Call to OR Michael Boston, MD      . lactated ringers infusion   Intravenous Continuous Annye Asa, MD      . metroNIDAZOLE (FLAGYL) IVPB 500 mg  500 mg Intravenous Once Michael Boston, MD       And  . vancomycin (VANCOCIN) IVPB 1000 mg/200 mL premix  1,000 mg Intravenous 60 min Pre-Op Michael Boston, MD         Allergies  Allergen Reactions  .  Sulfa Antibiotics Itching  . Penicillins Itching, Rash and Other (See Comments)    Has patient had a PCN reaction causing immediate rash, facial/tongue/throat swelling, SOB or lightheadedness with hypotension: No Has patient had a PCN reaction causing severe rash involving mucus membranes or skin necrosis: No Has patient had a PCN reaction that required hospitalization: Yes Has patient had a PCN reaction occurring within the last 10 years: No If all of the above answers are "NO", then may proceed with Cephalosporin use.     BP (!) 148/79   Pulse 88   Temp 98.2 F (36.8 C) (Oral)   Resp 16   Ht 5\' 2"  (1.575 m)   Wt 81.3 kg (179 lb 3.2 oz)   SpO2 95%   BMI 32.78 kg/m   Labs: No results found for this or any previous visit (from the past 48 hour(s)).  Imaging / Studies: No results found.   Adin Hector, M.D., F.A.C.S. Gastrointestinal and Minimally Invasive Surgery [facility] [address], [place], [place] [address] [phone] Main / Paging  04/19/2017 9:47 AM    Adin Hector

## 2017-04-19 NOTE — Discharge Instructions (Signed)
ANORECTAL SURGERY:  POST OPERATIVE INSTRUCTIONS  ######################################################################  EAT Start with a pureed / full liquid diet After 24 hours, gradually transition to a high fiber diet.    CONTROL PAIN Control pain so you can tolerate bowel movements,  walk, sleep, tolerate sneezing/coughing, and go up/down stairs.   HAVE A BOWEL MOVEMENT DAILY Keep your bowels regular to avoid problems.   Taking a fiber supplement every day to keep bowels soft.   Try a laxative to override constipation. Use an antidairrheal to slow down diarrhea.   Call if not better after 2 tries  WALK Walk an hour a day.  Control your pain to do that.   CALL IF YOU HAVE PROBLEMS/CONCERNS Call if you are still struggling despite following these instructions. Call if you have concerns not answered by these instructions  ######################################################################    1. Take your usually prescribed home medications unless otherwise directed. 2. DIET: Follow a light bland diet the first 24 hours after arrival home, such as soup, liquids, crackers, etc.  Be sure to include lots of fluids daily.  Avoid fast food or heavy meals as your are more likely to get nauseated.  Eat a low fat the next few days after surgery.   3. PAIN CONTROL: a. Pain is best controlled by a usual combination of three different methods TOGETHER: i. Ice/Heat ii. Over the counter pain medication iii. Prescription pain medication b. Expect swelling and discomfort in the anus/rectal area.  Warm water baths (30-60 minutes up to 6 times a day, especially after bowel meovements) will help. Use ice for the first few days to help decrease swelling and bruising, then switch to heat such as warm towels, sitz baths, warm baths, etc to help relax tight/sore spots and speed recovery.  Some people prefer to use ice alone, heat alone, alternating between ice & heat.  Experiment to what works  for you.   c. It is helpful to take an over-the-counter pain medication regularly for the first few weeks.  Choose one of the following that works best for you: i. Naproxen (Aleve, etc)  Two 220mg  tabs twice a day ii. Ibuprofen (Advil, etc) Three 200mg  tabs four times a day (every meal & bedtime) iii. Acetaminophen (Tylenol, etc) 500-650mg  four times a day (every meal & bedtime) d. A  prescription for pain medication (such as oxycodone, hydrocodone, etc) should be given to you upon discharge.  Take your pain medication as prescribed.  i. If you are having problems/concerns with the prescription medicine (does not control pain, nausea, vomiting, rash, itching, etc), please call us [phone] to see if we need to switch you to a different pain medicine that will work better for you and/or control your side effect better. ii. If you need a refill on your pain medication, please contact your pharmacy.  They will contact our office to request authorization. Prescriptions will not be filled after 5 pm or on week-ends.  Use a Sitz Bath 4-8 times a day for relief   [employer] A sitz bath is a warm water bath taken in the sitting position that covers only the hips and buttocks. It may be used for either healing or hygiene purposes. Sitz baths are also used to relieve pain, itching, or muscle spasms. The water may contain medicine. Moist heat will help you heal and relax.  HOME CARE INSTRUCTIONS  Take 3 to 4 sitz baths a day. 1. Fill the bathtub half full with warm water. 2. Sit in the  water and open the drain a little. 3. Turn on the warm water to keep the tub half full. Keep the water running constantly. 4. Soak in the water for 15 to 20 minutes. 5. After the sitz bath, pat the affected area dry first.   4. KEEP YOUR BOWELS REGULAR a. The goal is one bowel movement a day b. Avoid getting constipated.  Between the surgery and the pain medications, it is common to experience some constipation.   Increasing fluid intake and taking a fiber supplement (such as Metamucil, Citrucel, FiberCon, MiraLax, etc) 2-3 times a day regularly will usually help prevent this problem from occurring.  A mild laxative (prune juice, Milk of Magnesia, MiraLax, etc) should be taken according to package directions if there are no bowel movements after 48 hours. c. Watch out for diarrhea.  If you have many loose bowel movements, simplify your diet to bland foods & liquids for a few days.  Stop any stool softeners and decrease your fiber supplement.  Switching to mild anti-diarrheal medications (Kayopectate, Pepto Bismol) can help.  If this worsens or does not improve, please call us.  5. Wound Care  a. Remove your bandages with your first bowel movement, usually the day after surgery.  You may have packing if you had an abscess.  Let any packing or gauze fall come out.   b. Wear an absorbent pad or soft cotton balls in your underwear as needed to catch any drainage and help keep the area  c. Keep the area clean and dry.  Bathe / shower every day.  Keep the area clean by showering / bathing over the incision / wound.   It is okay to soak an open wound to help wash it.  Consider using a squeeze bottle filled with warm water to gently wash the anal area.  Wet wipes or showers / gentle washing after bowel movements is often less traumatic than regular toilet paper. d. Dennis Bast will often notice bleeding with bowel movements.  This should slow down by the end of the first week of surgery.  Sitting on an ice pack can help. e. Expect some drainage.  This should slow down by the end of the first week of surgery, but you will have occasional bleeding or drainage up to a few months after surgery.  Wear an absorbent pad or soft cotton gauze in your underwear until the drainage stops.  6. ACTIVITIES as tolerated:   a. You may resume regular (light) daily activities beginning the next day--such as daily self-care, walking, climbing  stairs--gradually increasing activities as tolerated.  If you can walk 30 minutes without difficulty, it is safe to try more intense activity such as jogging, treadmill, bicycling, low-impact aerobics, swimming, etc. b. Save the most intensive and strenuous activity for last such as sit-ups, heavy lifting, contact sports, etc  Refrain from any heavy lifting or straining until you are off narcotics for pain control.   c. DO NOT PUSH THROUGH PAIN.  Let pain be your guide: If it hurts to do something, don't do it.  Pain is your body warning you to avoid that activity for another week until the pain goes down. d. You may drive when you are no longer taking prescription pain medication, you can comfortably sit for long periods of time, and you can safely maneuver your car and apply brakes. e. Dennis Bast may have sexual intercourse when it is comfortable.  7. FOLLOW UP in our office a. Please call [facility] at [phone] to set up an appointment to see your surgeon in the office for a follow-up appointment approximately 2-3 weeks after your surgery. b. Make sure that you call for this appointment the day you arrive home to ensure a convenient appointment time.  8. IF YOU HAVE DISABILITY OR FAMILY LEAVE FORMS, BRING THEM TO THE OFFICE FOR PROCESSING.  DO NOT GIVE THEM TO YOUR DOCTOR.        WHEN TO CALL us [phone]: 1. Poor pain control 2. Reactions / problems with new medications (rash/itching, nausea, etc)  3. Fever over 101.5 F (38.5 C) 4. Inability to urinate 5. Nausea and/or vomiting 6. Worsening swelling or bruising 7. Continued bleeding from incision. 8. Increased pain, redness, or drainage from the incision  The clinic staff is available to answer your questions during regular business hours (8:30am-5pm).  Please dont hesitate to call and ask to speak to one of our nurses for clinical concerns.   A surgeon from [facility] is always on call at the hospitals   If you have a  medical emergency, go to the nearest emergency room or call 911.    [facility], [place], [place], [place], [place]  [address] ? MAIN: [phone] ? TOLL FREE: [phone] ? FAX [phone] [email or web address]    HEMORRHOIDS  The rectum is the last foot of your colon, and it naturally stretches to hold stool.  Hemorrhoidal piles are natural clusters of blood vessels that help the rectum and anal canal stretch to hold stool and allow bowel movements to eliminate feces.   Hemorrhoids are abnormally swollen blood vessels in the rectum.  Too much pressure in the rectum causes hemorrhoids by forcing blood to stretch and bulge the walls of the veins, sometimes even rupturing them.  Hemorrhoids can become like varicose veins you might see on a person's legs.  Most people will develop a flare of hemorrhoids in their lifetime.  When bulging hemorrhoidal veins are irritated, they can swell, burn, itch, cause pain, and bleed.  Most flares will calm down gradually own within a few weeks.  However, once hemorrhoids are created, they are difficult to get rid of completely and tend to flare more easily than the first flare.   Fortunately, good habits and simple medical treatment usually control hemorrhoids well, and surgery is needed only in severe cases. Types of Hemorrhoids:  Internal hemorrhoids usually don't initially hurt or itch; they are deep inside the rectum and usually have no sensation. If they begin to push out (prolapse), pain and burning can occur.  However, internal hemorrhoids can bleed.  Anal bleeding should not be ignored since bleeding could come from a dangerous source like colorectal cancer, so persistent rectal bleeding should be investigated by a doctor, sometimes with a colonoscopy.  External hemorrhoids cause most of the symptoms - pain, burning, and itching. Nonirritated hemorrhoids can look like small skin tags coming out of the anus.     Thrombosed hemorrhoids can form when a hemorrhoid blood vessel bursts and causes the hemorrhoid to suddenly swell.  A purple blood clot can form in it and become an excruciatingly painful lump at the anus. Because of these unpleasant symptoms, immediate incision and drainage by a surgeon at an office visit can provide much relief of the pain.    PREVENTION Avoiding the most frequent causes listed below will prevent most cases of hemorrhoids: Constipation Hard stools Diarrhea  Constant sitting  Straining with bowel movements  Sitting on the toilet for a long time  Severe coughing  episodes Pregnancy / Childbirth  Heavy Lifting  Sometimes avoiding the above triggers is difficult:  How can you avoid sitting all day if you have a seated job? Also, we try to avoid coughing and diarrhea, but sometimes its beyond your control.  Still, there are some practical hints to help: Keep the anal and genital area clean.  Moistened tissues such as flushable wet wipes are less irritating than toilet paper.  Using irrigating showers or bottle irrigation washing gently cleans this sensitive area.   Avoid dry toilet paper when cleaning after bowel movements.  Marland Kitchen Keep the anal and genital area dry.  Lightly pat the rectal area dry.  Avoid rubbing.  Talcum or baby powders can help GET YOUR STOOLS SOFT.   This is the most important way to prevent irritated hemorrhoids.  Hard stools are like sandpaper to the anorectal canal and will cause more problems.  The goal: ONE SOFT BOWEL MOVEMENT A DAY!  BMs from every other day to 3 times a day is a tolerable range Treat coughing, diarrhea and constipation early since irritated hemorrhoids may soon follow.  If your main job activity is seated, always stand or walk during your breaks. Make it a point to stand and walk at least 5 minutes every hour and try to shift frequently in your chair to avoid direct rectal pressure.  Always exhale as you strain or lift. Don't hold your  breath.  Do not delay or try to prevent a bowel movement when the urge is present. Exercise regularly (walking or jogging 60 minutes a day) to stimulate the bowels to move. No reading or other activity while on the toilet. If bowel movements take longer than 5 minutes, you are too constipated. AVOID CONSTIPATION Drink plenty of liquids (1 1/2 to 2 quarts of water and other fluids a day unless fluid restricted for another medical condition). Liquids that contain caffeine (coffee a, tea, soft drinks) can be dehydrating and should be avoided until constipation is controlled. Consider minimizing milk, as dairy products may be constipating. Eat plenty of fiber (30g a day ideal, more if needed).  Fiber is the undigested part of plant food that passes into the colon, acting as natures broom to encourage bowel motility and movement.  Fiber can absorb and hold large amounts of water. This results in a larger, bulkier stool, which is soft and easier to pass.  Eating foods high in fiber - 12 servings - such as  Vegetables: Root (potatoes, carrots, turnips), Leafy green (lettuce, salad greens, celery, spinach), High residue (cabbage, broccoli, etc.) Fruit: Fresh, Dried (prunes, apricots, cherries), Stewed (applesauce)  Whole grain breads, pasta, whole wheat Bran cereals, muffins, etc. Consider adding supplemental bulking fiber which retains large volumes of water: Psyllium ground seeds (native plant from [place])--available as Metamucil, Konsyl, Effersyllium, Per Diem Fiber, or the less expensive generic forms.  Citrucel  (methylcellulose wood fiber) . FiberCon (Polycarbophil) Polyethylene Glycol - and artificial fiber commonly called Miralax or Glycolax.  It is helpful for people with gassy or bloated feelings with regular fiber Flax Seed - a less gassy natural fiber  Laxatives can be useful for a short period if constipation is severe Osmotics (Milk of Magnesia, Fleets Phospho-Soda, Magnesium  Citrate)  Stimulants (Senokot,   Castor Oil,  Dulcolax, Ex-Lax)    Laxatives are not a good long-term solution as it can stress the bowels and cause too much mineral loss and  dehydration.   Avoid taking laxatives for more than 7 days in a row.  AVOID DIARRHEA Switch to liquids and simpler foods for a few days to avoid stressing your intestines further. Avoid dairy products (especially milk & ice cream) for a short time.  The intestines often can lose the ability to digest lactose when stressed. Avoid foods that cause gassiness or bloating.  Typical foods include beans and other legumes, cabbage, broccoli, and dairy foods.  Every person has some sensitivity to other foods, so listen to your body and avoid those foods that trigger problems for you. Adding fiber (Citrucel, Metamucil, FiberCon, Flax seed, Miralax) gradually can help thicken stools by absorbing excess fluid and retrain the intestines to act more normally.  Slowly increase the dose over a few weeks.  Too much fiber too soon can backfire and cause cramping & bloating. Probiotics (such as active yogurt, Align, etc) may help repopulate the intestines and colon with normal bacteria and calm down a sensitive digestive tract.  Most studies show it to be of mild help, though, and such products can be costly. Medicines: Bismuth subsalicylate (ex. Kayopectate, Pepto Bismol) every 30 minutes for up to 6 doses can help control diarrhea.  Avoid if pregnant. Loperamide (Immodium) can slow down diarrhea.  Start with two tablets (4mg  total) first and then try one tablet every 6 hours.  Avoid if you are having fevers or severe pain.  If you are not better or start feeling worse, stop all medicines and call your doctor for advice Call your doctor if you are getting worse or not better.  Sometimes further testing (cultures, endoscopy, X-ray studies, bloodwork, etc) may be needed to help diagnose and treat the cause of the diarrhea.  TROUBLESHOOTING IRREGULAR  BOWELS 1) Avoid extremes of bowel movements (no bad constipation/diarrhea) 2) Miralax 17gm mixed in 8oz. water or juice-daily. May use BID as needed.  3) Gas-x,Phazyme, etc. as needed for gas & bloating.  4) Soft,bland diet. No spicy,greasy,fried foods.  5) Prilosec over-the-counter as needed  6) May hold gluten/wheat products from diet to see if symptoms improve.  7)  May try probiotics (Align, Activa, etc) to help calm the bowels down 7) If symptoms become worse call back immediately.   TREATMENT OF HEMORRHOID FLARE If these preventive measures fail, you must take action right away! Hemorrhoids are one condition that can be mild in the morning and become intolerable by nightfall. Most hemorrhoidal flares take several weeks to calm down.  These suggestions can help: Warm soaks.  This helps more than any topical medication.  Use up to 8 times a day.  Usually sitz baths or sitting in a warm bathtub helps.  Sitting on moist warm towels are helpful.  Switching to ice packs/cool compresses can be helpful  Use a Sitz Bath 4-8 times a day for relief A sitz bath is a warm water bath taken in the sitting position that covers only the hips and buttocks. It may be used for either healing or hygiene purposes. Sitz baths are also used to relieve pain, itching, or muscle spasms. The water may contain medicine. Moist heat will help you heal and relax.  HOME CARE INSTRUCTIONS  Take 3 to 4 sitz baths a day. 6. Fill the bathtub half full with warm water. 7. Sit in the water and open the drain a little. 8. Turn on the warm water to keep the tub half full. Keep the water running constantly. 9. Soak in the water for 15  to 20 minutes. 10. After the sitz bath, pat the affected area dry first. SEEK MEDICAL CARE IF:  You get worse instead of better. Stop the sitz baths if you get worse.  Normalize your bowels.  Extremes of diarrhea or constipation will make hemorrhoids worse.  One soft bowel movement a day is  the goal.  Fiber can help get your bowels regular Wet wipes instead of toilet paper Pain control with a NSAID such as ibuprofen (Advil) or naproxen (Aleve) or acetaminophen (Tylenol) around the clock.  Narcotics are constipating and should be minimized if possible Topical creams contain steroids (bydrocortisone) or local anesthetic (xylocaine) can help make pain and itching more tolerable.   EVALUATION If hemorrhoids are still causing problems, you could benefit by an evaluation by a surgeon.  The surgeon will obtain a history and examine you.  If hemorrhoids are diagnosed, some therapies can be offered in the office, usually with an anoscope into the less sensitive area of the rectum: -injection of hemorrhoids (sclerotherapy) can scar the blood vessels of the swollen/enlarged hemorrhoids to help shrink them down to a more normal size -rubber banding of the enlarged hemorrhoids to help shrink them down to a more normal size -drainage of the blood clot causing a thrombosed hemorrhoid,  to relieve the severe pain   While 90% of the time such problems from hemorrhoids can be managed without preceding to surgery, sometimes the hemorrhoids require a operation to control the problem (uncontrolled bleeding, prolapse, pain, etc.).   This involves being placed under general anesthesia where the surgeon can confirm the diagnosis and remove, suture, or staple the hemorrhoid(s).  Your surgeon can help you treat the problem appropriately.

## 2017-04-19 NOTE — Anesthesia Postprocedure Evaluation (Signed)
Anesthesia Post Note  Patient: Kirsten Cabrera  Procedure(s) Performed: HEMORRHOIDECTOMY WITH HEMORRHOIDAL LIGATION/PEXY (N/A Rectum) ANORECTAL EXAM UNDER ANESTHESIA (N/A )     Patient location during evaluation: PACU Anesthesia Type: General Level of consciousness: awake and alert, oriented and patient cooperative Pain management: pain level controlled Vital Signs Assessment: post-procedure vital signs reviewed and stable Respiratory status: spontaneous breathing, nonlabored ventilation and respiratory function stable Cardiovascular status: blood pressure returned to baseline and stable Postop Assessment: no apparent nausea or vomiting Anesthetic complications: no    Last Vitals:  Vitals:   04/19/17 1257 04/19/17 1404  BP: 117/75 125/69  Pulse: 71 72  Resp: 18 18  Temp:  (!) 36.4 C  SpO2: 95% 93%    Last Pain:  Vitals:   04/19/17 1404  TempSrc:   PainSc: 0-No pain                 Nalini Alcaraz,E. Kadesha Virrueta

## 2017-04-19 NOTE — Anesthesia Preprocedure Evaluation (Signed)
Anesthesia Evaluation  Patient identified by MRN, date of birth, ID band Patient awake    Reviewed: Allergy & Precautions, NPO status , Patient's Chart, lab work & pertinent test results  History of Anesthesia Complications Negative for: history of anesthetic complications  Airway Mallampati: I  TM Distance: >3 FB Neck ROM: Full    Dental  (+) Edentulous Upper, Dental Advisory Given, Missing, Poor Dentition   Pulmonary COPD,  COPD inhaler, Current Smoker,    breath sounds clear to auscultation       Cardiovascular hypertension, Pt. on medications  Rhythm:Regular Rate:Normal     Neuro/Psych negative neurological ROS     GI/Hepatic negative GI ROS, Neg liver ROS, GERD  Medicated and Controlled,  Endo/Other  Morbid obesity  Renal/GU negative Renal ROS     Musculoskeletal  (+) Arthritis , Osteoarthritis,    Abdominal (+) + obese,   Peds  Hematology   Anesthesia Other Findings   Reproductive/Obstetrics                             Anesthesia Physical Anesthesia Plan  ASA: III  Anesthesia Plan: General   Post-op Pain Management:    Induction: Intravenous  PONV Risk Score and Plan: 2 and Ondansetron and Dexamethasone  Airway Management Planned: Oral ETT  Additional Equipment:   Intra-op Plan:   Post-operative Plan: Extubation in OR  Informed Consent: I have reviewed the patients History and Physical, chart, labs and discussed the procedure including the risks, benefits and alternatives for the proposed anesthesia with the patient or authorized representative who has indicated his/her understanding and acceptance.   Dental advisory given  Plan Discussed with: CRNA and Surgeon  Anesthesia Plan Comments: (Plan routine monitors, GETA)        Anesthesia Quick Evaluation

## 2017-04-19 NOTE — Anesthesia Procedure Notes (Signed)
Procedure Name: Intubation Date/Time: 04/19/2017 10:51 AM Performed by: Lavina Hamman, CRNA Pre-anesthesia Checklist: Patient identified, Emergency Drugs available, Suction available and Patient being monitored Patient Re-evaluated:Patient Re-evaluated prior to induction Oxygen Delivery Method: Circle System Utilized Preoxygenation: Pre-oxygenation with 100% oxygen Induction Type: IV induction Ventilation: Mask ventilation without difficulty Laryngoscope Size: Mac and 3 Grade View: Grade I Tube type: Oral Tube size: 7.0 mm Number of attempts: 1 Airway Equipment and Method: Stylet and Oral airway Placement Confirmation: ETT inserted through vocal cords under direct vision,  positive ETCO2 and breath sounds checked- equal and bilateral Secured at: 22 cm Tube secured with: Tape Dental Injury: Teeth and Oropharynx as per pre-operative assessment  Comments: DL by Darrell Jewel, SRNA

## 2017-04-19 NOTE — Op Note (Signed)
04/19/2017  12:26 PM  PATIENT:  Kirsten Cabrera  67 y.o. female  Patient Care Team: Harlan Stains, MD as PCP - General (Family Medicine) Michael Boston, MD as Consulting Physician (General Surgery)  PRE-OPERATIVE DIAGNOSIS:  Hemorrhoids grade 4 prolapsed with bleeding  POST-OPERATIVE DIAGNOSIS:    Hemorrhoids grade 4 prolapsed with bleeding Anterior rectal wall mass (?hyperpigmented polyp versus hemorrhoid?) Perianal cyst  PROCEDURE:  Procedure(s): HEMORRHOIDECTOMY WITH HEMORRHOIDAL LIGATION/PEXY ANORECTAL EXAM UNDER ANESTHESIA  Internal and external hemorrhoidectomy  x1  Excision of anterior rectal wall mass.   Excision of perianal cyst. Internal hemorrhoidal ligation and pexy Anorectal examination under anesthesia  SURGEON:  Adin Hector, MD  ANESTHESIA:   General Anorectal & Local field block  0.25% bupivacaine with epinephrine at the beginning of the case. Liposomal bupivacaine (Experel) at the end of the case.  EBL:  Total I/O In: 800 [I.V.:800] Out: 25 [Blood:25].  See operative record  Delay start of Pharmacological VTE agent (>24hrs) due to surgical blood loss or risk of bleeding:  NO  DRAINS: NONE  SPECIMEN:   Internal & external hemorrhoidx1  DISPOSITION OF SPECIMEN:  PATHOLOGY  COUNTS:  YES  PLAN OF CARE: Discharge home after PACU  PATIENT DISPOSITION:  PACU - hemodynamically stable.  INDICATION: Pleasant patient with struggles with hemorrhoids.  Not able to be managed in the office despite an improved bowel regimen.  I recommended  examination under anesthesia and surgical treatment:  The anatomy & physiology of the anorectal region was discussed.  The pathophysiology of hemorrhoids and differential diagnosis was discussed.  Natural history risks without surgery was discussed.   I stressed the importance of a bowel regimen to have daily soft bowel movements to minimize progression of disease.  Interventions such as sclerotherapy & banding  were discussed.  The patient's symptoms are not adequately controlled by medicines and other non-operative treatments.  I feel the risks & problems of no surgery outweigh the operative risks; therefore, I recommended surgery to treat the hemorrhoids by ligation, pexy, and possible resection.  Risks such as bleeding, infection, need for further treatment, heart attack, death, and other risks were discussed.   I noted a good likelihood this will help address the problem.  Goals of post-operative recovery were discussed as well.  Possibility that this will not correct all symptoms was explained.  Post-operative pain, bleeding, constipation, urinary difficulties, and other problems after surgery were discussed.  We will work to minimize complications.   Educational handouts further explaining the pathology, treatment options, and bowel regimen were given as well.  Questions were answered.  The patient expresses understanding & wishes to proceed with surgery.  OR FINDINGS: Large left lateral chronically prolapsed internal/external hemorrhoid.  Excised.  Grade 1-2 right anterior right posterior piles.  Ligation and pexy done  Right anterior 2 mm sinus opening.  Did not probe or track.  Excised.  Seen consistent with superficial perianal cyst and not a fistula.  Anterior midline rectal wall hyperpigmented polypoid mass.  1-2.5 cm from anal verge.  Soft.  Most likely hyperpigmented polyp or hemorrhoid.  Excised.  DESCRIPTION:   Informed consent was confirmed. Patient underwent general anesthesia without difficulty. Patient was placed into prone positioning.  The perianal region was prepped and draped in sterile fashion. Surgical time-out confirmed our plan.  I did digital rectal examination and then transitioned over to anoscopy to get a sense of the anatomy.  Findings noted above.   There was a 2 mm sinus in the right  anterior perianal region about 15 mm from the anal verge.  No purulence expressed.   Would not probe.  I excised and came into the soft gluteal fat without any deeper sinus tract.  Left the wound open.  10 mm wound.  5 mm deep  I proceeded to do hemorrhoidal ligation and pexy.  I used a 2-0 Vicryl suture on a UR-6 needle in a figure-of-eight fashion 6 cm proximal to the anal verge.  I started at the largest hemorrhoid pile.  Because of redundant hemorrhoidal tissue too bulky to merely ligate or pexy, I excised the excess internal hemorrhoid piles longitudinally in a fusiform biconcave fashion, at the  left lateral location, sparing the anal canal to avoid narrowing.  I then ran that stitch longitudinally more distally to close the hemorrhoidectomy wound to the anal verge over a Parks self retaining retractor & occasionally a large Hill-Furgeson retractor to avoid narrowing of the anal canal.  I then tied that stitch down to cause a hemorrhoidopexy.   I then excised the hyperpigmented polypoid lesion in the anterior midline.  Most likely hypopigmented polyp versus hemorrhoid.  Did a longitudinal hemorrhoidal ligation and patency through this as well.  I then did hemorrhoidal ligation and pexy at the other 4 columns.  At the completion of this, all 6 anorectal columns were ligated and pexied in the classic hexagonal fashion (right anterior/lateral/posterior, left anterior/lateral/posterior).  I closed the external part of the hemorrhoidectomy wounds with interrupted horizontal mattress 2-0 chromic suture, leaving the last 5 mm open to allow natural drainage.    I redid anoscopy & examination.  At completion of this, all hemorrhoids had been removed or reduced into the rectum.  There is no more prolapse.  Internal & external anatomy was more more normal.  Hemostasis was good.  Fluffed gauze was on-laid over the perianal region.  No packing done.  Patient is being extubated go to go to the recovery room.  I had discussed postop care in detail with the patient in the preop holding area.   Instructions for post-operative recovery and prescriptions are written. I discussed operative findings, updated the patient's status, discussed probable steps to recovery, and gave postoperative recommendations to the patient's family.  Recommendations were made.  Questions were answered.  She expressed understanding & appreciation.  Adin Hector, M.D., F.A.C.S. Gastrointestinal and Minimally Invasive Surgery [facility] [address], [place], [place] [address] [phone] Main / Paging

## 2017-04-19 NOTE — H&P (Signed)
Kirsten Cabrera  DOB: 03/06/1950  Patient Care Team: Harlan Stains, MD as PCP - General (Family Medicine) Michael Boston, MD as Consulting Physician (General Surgery)   Patient sent for surgical consultation at the request of Dr. Harlan Stains  Chief Complaint: Worsening hemorrhoids.  The patient is a woman that has had hemorrhoid surgery in the past is occasionally struggled with anal pain presumably hemorrhoids. Relocated from [facility]. Has struggled with some hemorrhoid issues for the past decade. Recalls having some type of hemorrhoid surgery in 2013. She notes that she gets intermittent bleeding and feels a lump around her anus when she wipes. Has been trying to manage it with baking soda soaks. No sharp pains but irritating when she wipes. Annoying and frustrating. She wish to have something done. Surgical consultation requested. She usually moves her bowels twice a day. Some mild lactose intolerance. She's never had a colonoscopy. She claims her fecal occult bloods were normal back in [place]. She smokes a few cigarettes a day but not heavy smoker. She had her tubes tied but no other abdominal surgeries. She does get short of breath after walking about 15-20 minutes but no exertional chest pain. No history of prior heart attack or stroke.  No personal nor family history of GI/colon cancer, inflammatory bowel disease, irritable bowel syndrome, allergy such as Celiac Sprue, dietary/dairy problems, colitis, ulcers nor gastritis. No recent sick contacts/gastroenteritis. No travel outside the country. No changes in diet. No dysphagia to solids or liquids. No significant heartburn or reflux. No hematochezia, hematemesis, coffee ground emesis. No evidence of prior gastric/peptic ulceration.  No events.  Ready for surgery  (Review of systems as stated in this history (HPI) or in the review of systems. Otherwise all other 12 point ROS are  negative)   Past Surgical History Levonne Spiller, [place]; 03/18/2017 2:30 PM) Breast Biopsy  Right. multiple Hemorrhoidectomy  Spinal Surgery - Lower Back   Diagnostic Studies History Levonne Spiller, CMA; 03/18/2017 2:30 PM) Colonoscopy  never Mammogram  within last year Pap Smear  1-5 years ago  Allergies Levonne Spiller, [place]; 03/18/2017 2:30 PM) Penicillins  Allergies Reconciled   Medication History Levonne Spiller, CMA; 03/18/2017 2:33 PM) Albuterol Sulfate ((2.5 MG/3ML)0.083% Nebulized Soln, Inhalation) Active. AmLODIPine Besylate (5MG  Tablet, Oral) Active. Azopt (1% Suspension, Ophthalmic) Active. Hydrocodone-Acetaminophen (Oral) Specific strength unknown - Active. Hydroxychloroquine Sulfate (200MG  Tablet, Oral) Active. Symbicort (160-4.5MCG/ACT Aerosol, Inhalation) Active. Triamcinolone Acetonide (0.1% Cream, External) Active. Tylenol Extra Strength (500MG  Tablet, Oral) Active. Vitamin B-12 (1000MCG Tablet, Oral) Active. Medications Reconciled  Social History Andee Poles Education officer, [employer], CMA; 03/18/2017 2:30 PM) Alcohol use  Occasional alcohol use. Caffeine use  Tea. No drug use  Tobacco use  Current some day smoker.  Family History Levonne Spiller, [place]; 03/18/2017 2:30 PM) Cerebrovascular Accident  Sister. Kidney Disease  Daughter.  Pregnancy / Birth History Levonne Spiller, [place]; 03/18/2017 2:30 PM) Age at menarche  54 years. Age of menopause  50-55 Contraceptive History  Oral contraceptives. Gravida  3 Para  3  Other Problems Levonne Spiller, CMA; 03/18/2017 2:30 PM) Arthritis  Back Pain  Chronic Obstructive Lung Disease  Hemorrhoids  High blood pressure     Review of Systems (Danielle Gerrigner CMA; 03/18/2017 2:30 PM) General Present- Night Sweats and Weight Gain. Not Present- Appetite Loss, Chills, Fatigue, Fever and Weight Loss. Skin Present- Dryness. Not Present- Change in Wart/Mole, Hives,  Jaundice, New Lesions, Non-Healing Wounds, Rash and Ulcer. HEENT Present- Nose Bleed and Wears glasses/contact lenses. Not Present- Earache, Hearing Loss, Hoarseness, Oral  Ulcers, Ringing in the Ears, Seasonal Allergies, Sinus Pain, Sore Throat, Visual Disturbances and Yellow Eyes. Breast Not Present- Breast Mass, Breast Pain, Nipple Discharge and Skin Changes. Cardiovascular Present- Leg Cramps, Shortness of Breath and Swelling of Extremities. Not Present- Chest Pain, Difficulty Breathing Lying Down, Palpitations and Rapid Heart Rate. Gastrointestinal Present- Bloating and Hemorrhoids. Not Present- Abdominal Pain, Bloody Stool, Change in Bowel Habits, Chronic diarrhea, Constipation, Difficulty Swallowing, Excessive gas, Gets full quickly at meals, Indigestion, Nausea, Rectal Pain and Vomiting. Musculoskeletal Present- Back Pain and Muscle Weakness. Not Present- Joint Pain, Joint Stiffness, Muscle Pain and Swelling of Extremities. Psychiatric Present- Change in Sleep Pattern. Not Present- Anxiety, Bipolar, Depression, Fearful and Frequent crying. Endocrine Present- Hair Changes and Hot flashes. Not Present- Cold Intolerance, Excessive Hunger, Heat Intolerance and New Diabetes. Hematology Not Present- Blood Thinners, Easy Bruising, Excessive bleeding, Gland problems, HIV and Persistent Infections.  Vitals Andee Poles Gerrigner CMA; 03/18/2017 2:33 PM) 03/18/2017 2:33 PM Weight: 184 lb Height: 62in Body Surface Area: 1.84 m Body Mass Index: 33.65 kg/m  Temp.: 98.51F(Oral)  Pulse: 103 (Regular)  BP: 162/90 (Sitting, Right Arm, Standard)   BP (!) 148/79   Pulse 88   Temp 98.2 F (36.8 C) (Oral)   Resp 16   SpO2 95%      Physical Exam Adin Hector MD; 03/18/2017 2:46 PM) General Mental Status-Alert. General Appearance-Not in acute distress, Not Sickly. Orientation-Oriented X3. Hydration-Well hydrated. Voice-Normal.  Integumentary Global Assessment Upon  inspection and palpation of skin surfaces of the - Axillae: non-tender, no inflammation or ulceration, no drainage. and Distribution of scalp and body hair is normal. General Characteristics Temperature - normal warmth is noted.  Head and Neck Head-normocephalic, atraumatic with no lesions or palpable masses. Face Global Assessment - atraumatic, no absence of expression. Neck Global Assessment - no abnormal movements, no bruit auscultated on the right, no bruit auscultated on the left, no decreased range of motion, non-tender. Trachea-midline. Thyroid Gland Characteristics - non-tender.  Eye Eyeball - Left-Extraocular movements intact, No Nystagmus. Eyeball - Right-Extraocular movements intact, No Nystagmus. Cornea - Left-No Hazy. Cornea - Right-No Hazy. Sclera/Conjunctiva - Left-No scleral icterus, No Discharge. Sclera/Conjunctiva - Right-No scleral icterus, No Discharge. Pupil - Left-Direct reaction to light normal. Pupil - Right-Direct reaction to light normal.  ENMT Ears Pinna - Left - no drainage observed, no generalized tenderness observed. Right - no drainage observed, no generalized tenderness observed. Nose and Sinuses External Inspection of the Nose - no destructive lesion observed. Inspection of the nares - Left - quiet respiration. Right - quiet respiration. Mouth and Throat Lips - Upper Lip - no fissures observed, no pallor noted. Lower Lip - no fissures observed, no pallor noted. Nasopharynx - no discharge present. Oral Cavity/Oropharynx - Tongue - no dryness observed. Oral Mucosa - no cyanosis observed. Hypopharynx - no evidence of airway distress observed.  Chest and Lung Exam Inspection Movements - Normal and Symmetrical. Accessory muscles - No use of accessory muscles in breathing. Palpation Palpation of the chest reveals - Non-tender. Auscultation Breath sounds - Normal and Clear.  Cardiovascular Auscultation Rhythm - Regular.  Murmurs & Other Heart Sounds - Auscultation of the heart reveals - No Murmurs and No Systolic Clicks.  Abdomen Inspection Inspection of the abdomen reveals - No Visible peristalsis and No Abnormal pulsations. Umbilicus - No Bleeding, No Urine drainage. Palpation/Percussion Palpation and Percussion of the abdomen reveal - Soft, Non Tender, No Rebound tenderness, No Rigidity (guarding) and No Cutaneous hyperesthesia. Note: Abdomen soft. Not severely  distended. No distasis recti. No umbilical or other anterior abdominal wall hernias   Female Genitourinary Sexual Maturity Tanner 5 - Adult hair pattern. Note: No vaginal bleeding nor discharge   Rectal Note: ` ` ` Left prolapsed mucosal mass consistent with grade 4 left posterior lateral hemorrhoid. Sensitive. No thrombosis. No definite fissure or fistula. Anoscopy confirms grade 2 internal hemorrhoids. Perianal skin clean with chronic moisture but no poor hygiene. No pruritis ani. No pilonidal disease. No fissure. No abscess/fistula. Normal sphincter tone.  No condyloma warts. Sensitive but tolerates digital and anoscopic rectal exam. No rectal masses.  .   Peripheral Vascular Upper Extremity Inspection - Left - No Cyanotic nailbeds, Not Ischemic. Right - No Cyanotic nailbeds, Not Ischemic.  Neurologic Neurologic evaluation reveals -normal attention span and ability to concentrate, able to name objects and repeat phrases. Appropriate fund of knowledge , normal sensation and normal coordination. Mental Status Affect - not angry, not paranoid. Cranial Nerves-Normal Bilaterally. Gait-Normal.  Neuropsychiatric Mental status exam performed with findings of-able to articulate well with normal speech/language, rate, volume and coherence, thought content normal with ability to perform basic computations and apply abstract reasoning and no evidence of hallucinations, delusions, obsessions or  homicidal/suicidal ideation. Note: Fair recollection but no obvious dementia   Musculoskeletal Global Assessment Spine, Ribs and Pelvis - no instability, subluxation or laxity. Right Upper Extremity - no instability, subluxation or laxity.  Lymphatic Head & Neck  General Head & Neck Lymphatics: Bilateral - Description - No Localized lymphadenopathy. Axillary  General Axillary Region: Bilateral - Description - No Localized lymphadenopathy. Femoral & Inguinal  Generalized Femoral & Inguinal Lymphatics: Left - Description - No Localized lymphadenopathy. Right - Description - No Localized lymphadenopathy.   Results Adin Hector MD; 03/18/2017 3:04 PM) Procedures  Name Value Date Hemorrhoids Procedure Anal exam: prolapse Internal exam: Internal Hemorroids ( non-bleeding) prolapse Other: Left prolapsed mucosal mass consistent with grade 4 left posterior lateral hemorrhoid. Sensitive. No thrombosis. No definite fissure or fistula. Anoscopy confirms grade 2 internal hemorrhoids............Marland KitchenPerianal skin clean with chronic moisture but no poor hygiene. No pruritis ani. No pilonidal disease. No fissure. No abscess/fistula. Normal sphincter tone. ....Marland KitchenMarland Kitcheno condyloma warts. Sensitive but tolerates digital and anoscopic rectal exam. No rectal masses.  Performed: 03/18/2017 2:51 PM    Assessment & Plan ) PROLAPSED INTERNAL HEMORRHOIDS, GRADE 4 (K64.3) Impression: Chronically prolapsed left posterolateral hemorrhoid. Sensitive. Chronic irritation. Doubt fistula with hypertrophy.  This will not go away without surgery. She wishes to consider this but definitely wants to be under general anesthesia. I don't blame her.  The anatomy & physiology of the anorectal region was discussed. The pathophysiology of hemorrhoids and differential diagnosis was discussed. Natural history progression was discussed. I stressed the importance of a bowel regimen to  have daily soft bowel movements to minimize progression of disease. Goal of one BM / day ideal. Use of wet wipes, warm baths, avoiding straining, etc were emphasized.  The anatomy & physiology of the anorectal region was discussed. The pathophysiology of hemorrhoids and differential diagnosis was discussed. Natural history risks without surgery was discussed. I stressed the importance of a bowel regimen to have daily soft bowel movements to minimize progression of disease. Interventions such as sclerotherapy & banding were discussed.  The patient's symptoms are not adequately controlled by medicines and other non-operative treatments. I feel the risks & problems of no surgery outweigh the operative risks; therefore, I recommended surgery to treat the hemorrhoids by ligation, pexy, and possible resection.  Risks such  as bleeding, infection, urinary difficulties, need for further treatment, heart attack, death, and other risks were discussed. I noted a good likelihood this will help address the problem. Goals of post-operative recovery were discussed as well. Possibility that this will not correct all symptoms was explained. Post-operative pain, bleeding, constipation, and other problems after surgery were discussed. We will work to minimize complications. Educational handouts further explaining the pathology, treatment options, and bowel regimen were given as well. Questions were answered. The patient expresses understanding & wishes to proceed with surgery.   Adin Hector, M.D., F.A.C.S. Gastrointestinal and Minimally Invasive Surgery [facility] [address], [place], [place] [address] [phone] Main / Paging

## 2017-04-19 NOTE — Transfer of Care (Signed)
Immediate Anesthesia Transfer of Care Note  Patient: Kirsten Cabrera  Procedure(s) Performed: HEMORRHOIDECTOMY WITH HEMORRHOIDAL LIGATION/PEXY (N/A Rectum) ANORECTAL EXAM UNDER ANESTHESIA (N/A )  Patient Location: PACU  Anesthesia Type:General  Level of Consciousness: awake, alert  and oriented  Airway & Oxygen Therapy: Patient Spontanous Breathing and Patient connected to face mask oxygen  Post-op Assessment: Report given to RN  Post vital signs: Reviewed and stable  Last Vitals:  Vitals Value Taken Time  BP 134/70 04/19/2017 12:15 PM  Temp    Pulse 80 04/19/2017 12:19 PM  Resp 14 04/19/2017 12:19 PM  SpO2 100 % 04/19/2017 12:19 PM  Vitals shown include unvalidated device data.  Last Pain:  Vitals:   04/19/17 1215  TempSrc:   PainSc: 0-No pain      Patients Stated Pain Goal: 4 (78/41/28 2081)  Complications: No apparent anesthesia complications

## 2017-04-20 ENCOUNTER — Encounter (HOSPITAL_COMMUNITY): Payer: Self-pay | Admitting: Surgery

## 2017-10-04 ENCOUNTER — Other Ambulatory Visit: Payer: Self-pay | Admitting: Obstetrics and Gynecology

## 2018-02-25 ENCOUNTER — Ambulatory Visit: Payer: Medicare Other | Admitting: Internal Medicine

## 2018-02-26 ENCOUNTER — Ambulatory Visit (INDEPENDENT_AMBULATORY_CARE_PROVIDER_SITE_OTHER): Payer: Medicare Other | Admitting: Internal Medicine

## 2018-02-26 ENCOUNTER — Encounter: Payer: Self-pay | Admitting: Internal Medicine

## 2018-02-26 VITALS — BP 151/75 | HR 97 | Temp 98.4°F | Wt 173.8 lb

## 2018-02-26 DIAGNOSIS — N761 Subacute and chronic vaginitis: Secondary | ICD-10-CM

## 2018-02-26 MED ORDER — FLUCONAZOLE 200 MG PO TABS: 200.0000 mg | ORAL_TABLET | Freq: Every day | ORAL | 0 refills | Status: DC

## 2018-02-26 NOTE — Progress Notes (Signed)
RFV: chronic, recurrent vaginitis  Patient ID: Kirsten Cabrera, female   DOB: 06/08/1950, 68 y.o.   MRN: [id number]  HPI Kirsten Cabrera is a 68yo F who reports numerous years of having chronic vaginitis. Most recently being followed by  -pan-sensitive PsA, nad MRSA S bactrim, and R tetra -  Outpatient Encounter Medications as of 02/26/2018  Medication Sig  . acetaminophen (TYLENOL) 500 MG tablet Take 1,000 mg by mouth every 6 (six) hours as needed for moderate pain or headache.  . albuterol (PROVENTIL HFA;VENTOLIN HFA) 108 (90 Base) MCG/ACT inhaler Inhale 2 puffs into the lungs every 4 (four) hours as needed for wheezing or shortness of breath.  Marland Kitchen albuterol (PROVENTIL) (2.5 MG/3ML) 0.083% nebulizer solution Take 2.5 mg by nebulization every 4 (four) hours as needed for wheezing or shortness of breath.  . AMBULATORY NON FORMULARY MEDICATION Place 1 application rectally 4 (four) times daily. DILTIAZEM 2% compounded suspension.  (Get Rx filled at a Set designer, such as Forensic [facility] or [facility] in [place], [place])  . amLODipine (NORVASC) 5 MG tablet Take 5 mg by mouth daily.  . brinzolamide (AZOPT) 1 % ophthalmic suspension Place 1 drop into the right eye 2 (two) times daily.  . budesonide-formoterol (SYMBICORT) 160-4.5 MCG/ACT inhaler Inhale 2 puffs into the lungs 2 (two) times daily.  . chlorthalidone (HYGROTON) 25 MG tablet Take 25 mg by mouth daily.  . Cyanocobalamin (VITAMIN B-12) 5000 MCG TBDP Take 5,000 mcg by mouth daily.  Marland Kitchen doxycycline (VIBRAMYCIN) 100 MG capsule Take 100 mg by mouth 2 (two) times daily. For 10 days, started it on 04/10/2017  . hydroxychloroquine (PLAQUENIL) 200 MG tablet Take 200 mg by mouth 2 (two) times daily.  Marland Kitchen oxyCODONE (OXY IR/ROXICODONE) 5 MG immediate release tablet Take 1-2 tablets (5-10 mg total) by mouth every 6 (six) hours as needed for moderate pain, severe pain or breakthrough pain.  . Probiotic Product (PROBIOTIC PO) Take 1  capsule by mouth daily.  . valACYclovir (VALTREX) 500 MG tablet Take 500 mg by mouth daily as needed (for out break).  . phenylephrine (LITTLE NOSES DECONGESTANT) 0.125 % nasal drops Place 1-2 drops into the nose every 6 (six) hours as needed for congestion.   No facility-administered encounter medications on file as of 02/26/2018.      Patient Active Problem List   Diagnosis Date Noted  . Prolapsed hemorrhoid status post left lateral hemorrhoid ligation/pexy/hemorrhoidectomy 04/19/2017 04/19/2017  . 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 Cyst of perianal area s/p excision 04/19/2017 04/19/2017     Health Maintenance Due  Topic Date Due  . Hepatitis C Screening  06-12-1950  . TETANUS/TDAP  08/25/1968  . MAMMOGRAM  08/26/1999  . COLONOSCOPY  08/26/1999  . DEXA SCAN  08/26/2014  . PNA vac Low Risk Adult (1 of 2 - PCV13) 08/26/2014  . INFLUENZA VACCINE  08/22/2017     Review of Systems  Physical Exam   BP (!) 151/75   Pulse 97   Temp 98.4 F (36.9 C) (Oral)   Wt 173 lb 12 oz (78.8 kg)   BMI 31.78 kg/m    No results found for: CD4TCELL No results found for: CD4TABS No results found for: HIV1RNAQUANT No results found for: HEPBSAB No results found for: RPR, LABRPR  CBC Lab Results  Component Value Date   WBC 5.1 04/15/2017   RBC 3.73 (L) 04/15/2017   HGB 12.1 04/15/2017   HCT 36.9 04/15/2017   PLT  289 04/15/2017   MCV 98.9 04/15/2017   MCH 32.4 04/15/2017   MCHC 32.8 04/15/2017   RDW 15.0 04/15/2017    BMET Lab Results  Component Value Date   NA 142 04/15/2017   K 3.4 (L) 04/15/2017   CL 107 04/15/2017   CO2 26 04/15/2017   GLUCOSE 76 04/15/2017   BUN 15 04/15/2017   CREATININE 1.16 (H) 04/15/2017   CALCIUM 9.3 04/15/2017   GFRNONAA 48 (L) 04/15/2017   GFRAA 55 (L) 04/15/2017      Assessment and Plan  Reach out to dr Richardson Dopp [facility] May be related to lupus.  Will do trial of fluconazole 200mg   daily x 5 d to see if any improvement  Stop doxy

## 2018-03-26 ENCOUNTER — Ambulatory Visit: Payer: Medicare Other | Admitting: Internal Medicine

## 2018-08-18 ENCOUNTER — Other Ambulatory Visit (HOSPITAL_COMMUNITY)
Admission: RE | Admit: 2018-08-18 | Discharge: 2018-08-18 | Disposition: A | Discharge status: Home / Self Care | Payer: Medicare Other | Source: Ambulatory Visit | Attending: Family Medicine | Admitting: Family Medicine

## 2018-08-18 ENCOUNTER — Other Ambulatory Visit: Payer: Self-pay | Admitting: Family Medicine

## 2018-08-18 DIAGNOSIS — Z124 Encounter for screening for malignant neoplasm of cervix: Secondary | ICD-10-CM | POA: Diagnosis present

## 2018-08-20 LAB — CYTOLOGY - PAP
Chlamydia: NEGATIVE
Diagnosis: NEGATIVE
HPV: NOT DETECTED
Neisseria Gonorrhea: NEGATIVE
Trichomonas: NEGATIVE

## 2018-10-29 ENCOUNTER — Other Ambulatory Visit: Payer: Self-pay | Admitting: Family Medicine

## 2018-10-29 DIAGNOSIS — Z1231 Encounter for screening mammogram for malignant neoplasm of breast: Secondary | ICD-10-CM

## 2018-12-16 ENCOUNTER — Other Ambulatory Visit: Payer: Self-pay

## 2018-12-16 ENCOUNTER — Ambulatory Visit
Admission: RE | Admit: 2018-12-16 | Discharge: 2018-12-16 | Disposition: A | Discharge status: Home / Self Care | Payer: Medicare Other | Source: Ambulatory Visit | Attending: Family Medicine | Admitting: Family Medicine

## 2018-12-16 DIAGNOSIS — Z1231 Encounter for screening mammogram for malignant neoplasm of breast: Secondary | ICD-10-CM

## 2019-05-13 ENCOUNTER — Telehealth: Payer: Self-pay | Admitting: Hematology and Oncology

## 2019-05-13 NOTE — Telephone Encounter (Signed)
Received a new hem referral from Dr. Kathlene November for positive m protein. Mrs. Kirsten Cabrera has been cld and scheduled to see Dr. Vianne Bulls on 5/6 at 2pm. Pt aware to arrive 15 minutes early.

## 2019-05-27 NOTE — Progress Notes (Signed)
[place] Telephone:([phone]) [phone]   Fax:([phone] [facility] NOTE  Patient Care Team: Harlan Stains, MD as PCP - General (Family Medicine) Michael Boston, MD as Consulting Physician (General Surgery)  Hematological/Oncological History # Monoclonal Gammopathy of Undetermined Significance  1) 05/06/2019: Cr 1.0, K 3.7, Ca 10.0, WBC 7.4, Hgb 12.8, MCV 99.7, Plt 260 2) 05/11/2019: SPEP showed M spike 0.8, UPEP showed M spike 89.1%. Protein/Cr ratio 1845 (urine creatinine 99.1)  3) 05/28/2019: establish care with Dr. Lorenso Courier   CHIEF COMPLAINTS/PURPOSE OF CONSULTATION:  "Monoclonal Gammopathy of Undetermined Significance  "  HISTORY OF PRESENTING ILLNESS:  Kirsten Cabrera 69 y.o. 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   On review of the previous records Kirsten Cabrera was seen by her rheumatologist on 05/06/2019 at which time she was found to have mild levels of proteinuria.  On 05/11/2019 the patient underwent SPEP which showed an M spike of 0.8, UPEP with an M spike of 89.1% as well as a protein creatinine ratio of 1845 (urine creatinine of 99.1).  Due to concerns for monoclonal gammopathy the patient was referred to hematology for further evaluation management.  On exam today Kirsten Cabrera reports she has been having a very difficult year emotionally.  She unfortunately has lost her daughter to a sudden cardiac death.  This occurred in Feb 18, 2022 of this year.  She previously lost another daughter to heart condition and notes that she has 1 daughter left whom she is concerned about her heart.  The patient notes that she does currently smoke and drink, but that it is fortunate recent occurrences have not "made me an alcoholic".  In regards to her lupus the patient reports that she has been followed by rheumatology for several years and acquired the diagnosis  in approximately 2016.  She notes that her lupus is currently under control and she is not having any symptoms at the moment.  She notes that she is "not as healthy as she used to be" reported that she cannot "run around" like she used to.  She notes that she is able to walk approximately 1 block before she becomes short of breath and must pause.  She notes that her COPD tends to be the limiting factor in her physical activity.  She currently denies having any issues with fevers, chills, sweats, nausea, vomiting or diarrhea.  She notes that she has not been having any issues with recurrent infections.  She denies any new bone or back pain.  A full 10 point ROS is listed below.  MEDICAL HISTORY:  Past Medical History:  Diagnosis Date  . Arthritis   . COPD (chronic obstructive pulmonary disease) ([place])   . Depression   . Dyspnea    with exertion  . Hypertension   . Prolapsed internal hemorrhoids, grade 4   . Tobacco abuse     SURGICAL HISTORY: Past Surgical History:  Procedure Laterality Date  . BACK SURGERY  2001   lumbra L4-S1  . BREAST SURGERY     right breast biopsy  . clitoris biopsy  2018  . EYE SURGERY     bilateral cataract surgery  . HEMORRHOID SURGERY  2015  . HEMORRHOID SURGERY N/A 04/19/2017   Procedure: HEMORRHOIDECTOMY WITH HEMORRHOIDAL LIGATION/PEXY;  Surgeon: Michael Boston, MD;  Location: [facility];  Service: General;  Laterality: N/A;  . TUBAL LIGATION     age 86    SOCIAL  HISTORY: Social History   Socioeconomic History  . Marital status: Widowed    Spouse name: Not on file  . Number of children: 3  . Years of education: Not on file  . Highest education level: Not on file  Occupational History  . Not on file  Tobacco Use  . Smoking status: Current Every Day Smoker    Packs/day: 0.25    Years: 20.00    Pack years: 5.00    Types: Cigarettes  . Smokeless tobacco: Never Used  Substance and Sexual Activity  . Alcohol use: Yes    Comment: occassionally  . Drug  use: Never  . Sexual activity: Not on file  Other Topics Concern  . Not on file  Social History Narrative  . Not on file   Social Determinants of Health   Financial Resource Strain:   . Difficulty of Paying Living Expenses:   Food Insecurity:   . Worried About Charity fundraiser in the Last Year:   . Arboriculturist in the Last Year:   Transportation Needs:   . Film/video editor (Medical):   Marland Kitchen Lack of Transportation (Non-Medical):   Physical Activity:   . Days of Exercise per Week:   . Minutes of Exercise per Session:   Stress:   . Feeling of Stress :   Social Connections:   . Frequency of Communication with Friends and Family:   . Frequency of Social Gatherings with Friends and Family:   . Attends Religious Services:   . Active Member of Clubs or Organizations:   . Attends Archivist Meetings:   Marland Kitchen Marital Status:   Intimate Partner Violence:   . Fear of Current or Ex-Partner:   . Emotionally Abused:   Marland Kitchen Physically Abused:   . Sexually Abused:     FAMILY HISTORY: Family History  Problem Relation Age of Onset  . Heart disease Mother   . Heart disease Daughter   . Heart disease Daughter     ALLERGIES:  is allergic to levofloxacin; sulfa antibiotics; and penicillins.  MEDICATIONS:  Current Outpatient Medications  Medication Sig Dispense Refill  . Ascorbic Acid (VITAMIN C) 1000 MG tablet Take 1,000 mg by mouth daily.    . Multiple Vitamin (MULTIVITAMIN) tablet Take 1 tablet by mouth daily.    Marland Kitchen VITAMIN D PO Take 800 mg by mouth daily.    Marland Kitchen acetaminophen (TYLENOL) 500 MG tablet Take 1,000 mg by mouth every 6 (six) hours as needed for moderate pain or headache.    . albuterol (PROVENTIL HFA;VENTOLIN HFA) 108 (90 Base) MCG/ACT inhaler Inhale 2 puffs into the lungs every 4 (four) hours as needed for wheezing or shortness of breath.    Marland Kitchen albuterol (PROVENTIL) (2.5 MG/3ML) 0.083% nebulizer solution Take 2.5 mg by nebulization every 4 (four) hours as needed  for wheezing or shortness of breath.    Marland Kitchen amLODipine (NORVASC) 5 MG tablet Take 5 mg by mouth daily.    . brinzolamide (AZOPT) 1 % ophthalmic suspension Place 1 drop into the right eye 2 (two) times daily.    . budesonide-formoterol (SYMBICORT) 160-4.5 MCG/ACT inhaler Inhale 2 puffs into the lungs 2 (two) times daily.    . chlorthalidone (HYGROTON) 25 MG tablet Take 25 mg by mouth daily.    . Cyanocobalamin (VITAMIN B-12) 5000 MCG TBDP Take 5,000 mcg by mouth daily.    . hydroxychloroquine (PLAQUENIL) 200 MG tablet Take 200 mg by mouth 2 (two) times daily.    Marland Kitchen  phenylephrine (LITTLE NOSES DECONGESTANT) 0.125 % nasal drops Place 1-2 drops into the nose every 6 (six) hours as needed for congestion.    . Probiotic Product (PROBIOTIC PO) Take 1 capsule by mouth daily.    . valACYclovir (VALTREX) 500 MG tablet Take 500 mg by mouth daily as needed (for out break).     No current facility-administered medications for this visit.    REVIEW OF SYSTEMS:   Constitutional: ( - ) fevers, ( - )  chills , ( - ) night sweats Eyes: ( - ) blurriness of vision, ( - ) double vision, ( - ) watery eyes Ears, nose, mouth, throat, and face: ( - ) mucositis, ( - ) sore throat Respiratory: ( - ) cough, ( - ) dyspnea, ( - ) wheezes Cardiovascular: ( - ) palpitation, ( - ) chest discomfort, ( - ) lower extremity swelling Gastrointestinal:  ( - ) nausea, ( - ) heartburn, ( - ) change in bowel habits Skin: ( - ) abnormal skin rashes Lymphatics: ( - ) new lymphadenopathy, ( - ) easy bruising Neurological: ( - ) numbness, ( - ) tingling, ( - ) new weaknesses Behavioral/Psych: ( - ) mood change, ( - ) new changes  All other systems were reviewed with the patient and are negative.  PHYSICAL EXAMINATION: ECOG PERFORMANCE STATUS: 1 - Symptomatic but completely ambulatory  Vitals:   05/28/19 1400  BP: (!) 142/74  Pulse: 89  Resp: 17  Temp: 98.3 F (36.8 C)  SpO2: 97%   Filed Weights   05/28/19 1400  Weight: 145  lb 11.2 oz (66.1 kg)    GENERAL: well appearing elderly African American female in NAD  SKIN: skin color, texture, turgor are normal, no rashes or significant lesions EYES: conjunctiva are pink and non-injected, sclera clear OROPHARYNX: no exudate, no erythema; lips, buccal mucosa, and tongue normal. No top row of teeth  LUNGS: clear to auscultation and percussion with normal breathing effort HEART: regular rate & rhythm and no murmurs and no lower extremity edema ABDOMEN: soft, non-tender, non-distended, normal bowel sounds Musculoskeletal: no cyanosis of digits and no clubbing  PSYCH: alert & oriented x 3, fluent speech NEURO: no focal motor/sensory deficits  LABORATORY DATA:  I have reviewed the data as listed CBC Latest Ref Rng & Units 05/28/2019 04/15/2017  WBC 4.0 - 10.5 K/uL 7.2 5.1  Hemoglobin 12.0 - 15.0 g/dL 13.3 12.1  Hematocrit 36.0 - 46.0 % 40.3 36.9  Platelets 150 - 400 K/uL 271 289    CMP Latest Ref Rng & Units 05/28/2019 04/15/2017  Glucose 70 - 99 mg/dL 75 76  BUN 8 - 23 mg/dL 19 15  Creatinine 0.44 - 1.00 mg/dL 1.13(H) 1.16(H)  Sodium 135 - 145 mmol/L 146(H) 142  Potassium 3.5 - 5.1 mmol/L 3.7 3.4(L)  Chloride 98 - 111 mmol/L 104 107  CO2 22 - 32 mmol/L 32 26  Calcium 8.9 - 10.3 mg/dL 10.0 9.3  Total Protein 6.5 - 8.1 g/dL 7.7 -  Total Bilirubin 0.3 - 1.2 mg/dL 0.3 -  Alkaline Phos 38 - 126 U/L 53 -  AST 15 - 41 U/L 26 -  ALT 0 - 44 U/L 20 -   PATHOLOGY: None relevant to review.   RADIOGRAPHIC STUDIES: No results found.  ASSESSMENT & PLAN Kirsten Cabrera 69 y.o. 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  After review the labs, review the outside records, discussion with  the patient her findings most consistent with a monoclonal gammopathy of undetermined significance.  Fortunately this time it does not appear the patient has any criteria as she has normal levels of  calcium, normal creatinine, and no anemia.  We will assess for lytic bone lesions with a DG bone survey and additionally will assess her monoclonal protein with immunofixation as well as an assessment of serum free light chains.    In the event the patient is found to have monoclonal gammopathy of undetermined significance (with no concern for multiple myeloma)  I would recommend routine monitoring our clinic every 6 to 12 months.  In the event that there are signs of multiple myeloma we would need to pursue a bone marrow biopsy.  Another possibility is that the proteinuria the patient is experiencing is secondary to amyloidosis, that we typically expect a higher degree of proteinuria to cause this.  I am in agreement with the patient being evaluated by nephrology for other possible etiologies of her proteinuria.  We will plan to evaluate her with the above studies and in the interim place a placeholder visit in 6 months time.  # Monoclonal Gammopathy of Undetermined Significance  --today will completed MGUS work up with SPEP, immunofixation, SFLC, LDH and beta 2 microglobulin. UPEP available in outside records  --additionally will order a DG bone survey to assess for lytic lesions  --no clear indication for a bone marrow biopsy at this time, though that could change based on the above studies.  --agree with evaluation by nephrology for proteinuria. Findings may be consistent with multiple myeloma vs. Amyloidosis. If no evidence of MM on bloodwork patient may require further evaluation including kidney biopsy. --RTC in 6 months or sooner if concerning abnormalities in above labs.   Orders Placed This Encounter  Procedures  . DG Bone Survey Met    Standing Status:   Future    Standing Expiration Date:   07/27/2020    Order Specific Question:   Reason for Exam (SYMPTOM  OR DIAGNOSIS REQUIRED)    Answer:   workup of MGUS    Order Specific Question:   Preferred imaging location?    Answer:   [facility]    Order Specific Question:   Radiology Contrast Protocol - do NOT remove file path    Answer:   [email or web address]  . CBC with Differential ([facility] Only)    Standing Status:   Future    Number of Occurrences:   1    Standing Expiration Date:   05/27/2020  . CMP ([facility] only)    Standing Status:   Future    Number of Occurrences:   1    Standing Expiration Date:   05/27/2020  . Lactate dehydrogenase (LDH)    Standing Status:   Future    Number of Occurrences:   1    Standing Expiration Date:   05/27/2020  . Multiple Myeloma Panel (SPEP&IFE w/QIG)    Standing Status:   Future    Number of Occurrences:   1    Standing Expiration Date:   05/27/2020  . Kappa/lambda light chains    Standing Status:   Future    Number of Occurrences:   1    Standing Expiration Date:   05/27/2020  . Beta 2 microglobulin    Standing Status:   Future    Number of Occurrences:   1    Standing Expiration Date:   05/27/2020    All questions  were answered. The patient knows to call the clinic with any problems, questions or concerns.  A total of more than 60 minutes were spent on this encounter and over half of that time was spent on counseling and coordination of care as outlined above.   [name], MD Department of Hematology/Oncology [place] at [facility] Phone: [phone] Pager: [phone] Email: [email or web address]  05/28/2019 4:26 PM

## 2019-05-28 ENCOUNTER — Inpatient Hospital Stay: Payer: Medicare Other

## 2019-05-28 ENCOUNTER — Encounter: Payer: Self-pay | Admitting: Hematology and Oncology

## 2019-05-28 ENCOUNTER — Inpatient Hospital Stay: Payer: Medicare Other | Attending: Hematology and Oncology | Admitting: Hematology and Oncology

## 2019-05-28 ENCOUNTER — Other Ambulatory Visit: Payer: Self-pay

## 2019-05-28 VITALS — BP 142/74 | HR 89 | Temp 98.3°F | Resp 17 | Ht 62.0 in | Wt 145.7 lb

## 2019-05-28 DIAGNOSIS — D472 Monoclonal gammopathy: Secondary | ICD-10-CM | POA: Diagnosis not present

## 2019-05-28 DIAGNOSIS — M329 Systemic lupus erythematosus, unspecified: Secondary | ICD-10-CM | POA: Insufficient documentation

## 2019-05-28 DIAGNOSIS — R809 Proteinuria, unspecified: Secondary | ICD-10-CM | POA: Insufficient documentation

## 2019-05-28 DIAGNOSIS — I10 Essential (primary) hypertension: Secondary | ICD-10-CM | POA: Diagnosis not present

## 2019-05-28 DIAGNOSIS — M8580 Other specified disorders of bone density and structure, unspecified site: Secondary | ICD-10-CM | POA: Insufficient documentation

## 2019-05-28 DIAGNOSIS — J449 Chronic obstructive pulmonary disease, unspecified: Secondary | ICD-10-CM | POA: Diagnosis not present

## 2019-05-28 DIAGNOSIS — Z7289 Other problems related to lifestyle: Secondary | ICD-10-CM | POA: Diagnosis not present

## 2019-05-28 DIAGNOSIS — F17210 Nicotine dependence, cigarettes, uncomplicated: Secondary | ICD-10-CM | POA: Insufficient documentation

## 2019-05-28 LAB — CMP (CANCER CENTER ONLY)
ALT: 20 U/L (ref 0–44)
AST: 26 U/L (ref 15–41)
Albumin: 4.3 g/dL (ref 3.5–5.0)
Alkaline Phosphatase: 53 U/L (ref 38–126)
Anion gap: 10 (ref 5–15)
BUN: 19 mg/dL (ref 8–23)
CO2: 32 mmol/L (ref 22–32)
Calcium: 10 mg/dL (ref 8.9–10.3)
Chloride: 104 mmol/L (ref 98–111)
Creatinine: 1.13 mg/dL — ABNORMAL HIGH (ref 0.44–1.00)
GFR, Est AFR Am: 57 mL/min — ABNORMAL LOW (ref >60)
GFR, Estimated: 50 mL/min — ABNORMAL LOW (ref >60)
Glucose, Bld: 75 mg/dL (ref 70–99)
Potassium: 3.7 mmol/L (ref 3.5–5.1)
Sodium: 146 mmol/L — ABNORMAL HIGH (ref 135–145)
Total Bilirubin: 0.3 mg/dL (ref 0.3–1.2)
Total Protein: 7.7 g/dL (ref 6.5–8.1)

## 2019-05-28 LAB — CBC WITH DIFFERENTIAL (CANCER CENTER ONLY)
Abs Immature Granulocytes: 0.01 10*3/uL (ref 0.00–0.07)
Basophils Absolute: 0 10*3/uL (ref 0.0–0.1)
Basophils Relative: 1 %
Eosinophils Absolute: 0.3 10*3/uL (ref 0.0–0.5)
Eosinophils Relative: 4 %
HCT: 40.3 % (ref 36.0–46.0)
Hemoglobin: 13.3 g/dL (ref 12.0–15.0)
Immature Granulocytes: 0 %
Lymphocytes Relative: 25 %
Lymphs Abs: 1.8 10*3/uL (ref 0.7–4.0)
MCH: 34.4 pg — ABNORMAL HIGH (ref 26.0–34.0)
MCHC: 33 g/dL (ref 30.0–36.0)
MCV: 104.1 fL — ABNORMAL HIGH (ref 80.0–100.0)
Monocytes Absolute: 0.7 10*3/uL (ref 0.1–1.0)
Monocytes Relative: 10 %
Neutro Abs: 4.4 10*3/uL (ref 1.7–7.7)
Neutrophils Relative %: 60 %
Platelet Count: 271 10*3/uL (ref 150–400)
RBC: 3.87 MIL/uL (ref 3.87–5.11)
RDW: 15.6 % — ABNORMAL HIGH (ref 11.5–15.5)
WBC Count: 7.2 10*3/uL (ref 4.0–10.5)
nRBC: 0 % (ref 0.0–0.2)

## 2019-05-28 LAB — LACTATE DEHYDROGENASE: LDH: 158 U/L (ref 98–192)

## 2019-05-29 LAB — BETA 2 MICROGLOBULIN, SERUM: Beta-2 Microglobulin: 4.9 mg/L — ABNORMAL HIGH (ref 0.6–2.4)

## 2019-05-29 LAB — KAPPA/LAMBDA LIGHT CHAINS
Kappa free light chain: 65.9 mg/L — ABNORMAL HIGH (ref 3.3–19.4)
Kappa, lambda light chain ratio: 2.28 — ABNORMAL HIGH (ref 0.26–1.65)
Lambda free light chains: 28.9 mg/L — ABNORMAL HIGH (ref 5.7–26.3)

## 2019-06-02 LAB — MULTIPLE MYELOMA PANEL, SERUM
Albumin SerPl Elph-Mcnc: 3.9 g/dL (ref 2.9–4.4)
Albumin/Glob SerPl: 1.2 (ref 0.7–1.7)
Alpha 1: 0.2 g/dL (ref 0.0–0.4)
Alpha2 Glob SerPl Elph-Mcnc: 0.7 g/dL (ref 0.4–1.0)
B-Globulin SerPl Elph-Mcnc: 1.8 g/dL — ABNORMAL HIGH (ref 0.7–1.3)
Gamma Glob SerPl Elph-Mcnc: 0.6 g/dL (ref 0.4–1.8)
Globulin, Total: 3.4 g/dL (ref 2.2–3.9)
IgA: 234 mg/dL (ref 87–352)
IgG (Immunoglobin G), Serum: 2563 mg/dL — ABNORMAL HIGH (ref 586–1602)
IgM (Immunoglobulin M), Srm: 15 mg/dL — ABNORMAL LOW (ref 26–217)
M Protein SerPl Elph-Mcnc: 1.1 g/dL — ABNORMAL HIGH
Total Protein ELP: 7.3 g/dL (ref 6.0–8.5)

## 2019-06-11 ENCOUNTER — Telehealth: Payer: Self-pay | Admitting: Hematology and Oncology

## 2019-06-11 NOTE — Telephone Encounter (Signed)
Called Kirsten Cabrera to discuss the results of her blood work from earlier this month.  SPEP results of come back concerning for an M protein of 1.1 with suspicion of heavy chain disease.  Additionally her serum free light chain ratio is elevated above the reference range.  Given these findings I would strongly recommend proceeding with a bone marrow biopsy in order to rule out underlying hematological malignancy.  Had to leave a voicemail as the patient did not answer her phone.  When she returns call we will relay this information and schedule her for a bone marrow biopsy and a follow-up appointment shortly thereafter.  [name], MD Department of Hematology/Oncology [place] at [facility] Phone: [phone] Pager: [phone] Email: [email or web address]

## 2019-06-17 ENCOUNTER — Ambulatory Visit (HOSPITAL_COMMUNITY): Admission: RE | Admit: 2019-06-17 | Payer: Medicare Other | Source: Ambulatory Visit

## 2019-06-18 ENCOUNTER — Other Ambulatory Visit: Payer: Self-pay | Admitting: Nephrology

## 2019-06-18 DIAGNOSIS — N1831 Chronic kidney disease, stage 3a: Secondary | ICD-10-CM

## 2019-06-24 ENCOUNTER — Telehealth: Payer: Self-pay | Admitting: *Deleted

## 2019-06-24 NOTE — Telephone Encounter (Signed)
Received call from patient regarding her lab results from May 2021. Dr. Dorsey had called her to discuss her results but there was no answer to that call.  Pt states she was moving to a new home at the time and it was challenging for her to return calls etc.  She also did not get her bone survey done and she is asking about that as well. Advised that I would have Dr. Dorsey return her call to explain about the lab results.  Pt will need a bone marrow biopsy. Advised that I would also call about her her bone survey. [facility]CT [facility] scheduling about her bone survey. They stated she was a 'no-show' for that. It is now re-scheduled for 06/29/19 @ 10 am.  TCT patient to advise of new appt. No answer. But was able to leave vm message for pt to call back @ [phone] 

## 2019-06-29 ENCOUNTER — Other Ambulatory Visit: Payer: Self-pay

## 2019-06-29 ENCOUNTER — Ambulatory Visit (HOSPITAL_COMMUNITY)
Admission: RE | Admit: 2019-06-29 | Discharge: 2019-06-29 | Disposition: A | Discharge status: Home / Self Care | Payer: Medicare Other | Source: Ambulatory Visit | Attending: Hematology and Oncology | Admitting: Hematology and Oncology

## 2019-06-29 DIAGNOSIS — D472 Monoclonal gammopathy: Secondary | ICD-10-CM | POA: Diagnosis not present

## 2019-06-30 ENCOUNTER — Ambulatory Visit
Admission: RE | Admit: 2019-06-30 | Discharge: 2019-06-30 | Disposition: A | Discharge status: Home / Self Care | Payer: Medicare Other | Source: Ambulatory Visit | Attending: Nephrology | Admitting: Nephrology

## 2019-06-30 DIAGNOSIS — N1831 Chronic kidney disease, stage 3a: Secondary | ICD-10-CM

## 2019-07-07 ENCOUNTER — Telehealth: Payer: Self-pay | Admitting: Hematology and Oncology

## 2019-07-07 NOTE — Telephone Encounter (Signed)
Called Ms. Kirsten Cabrera to discuss the results of her labs and metastatic survey.  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  Given these findings I would strongly recommend that we undergo a bone marrow biopsy in order to rule out hematological malignancy.  Unfortunately the patient did not answer the call.  Message was left requesting call back.  Once we have touch base with her we will set the patient up for a bone marrow biopsy.  [name], MD Department of Hematology/Oncology [place] at [facility] Phone: [phone] Pager: [phone] Email: [email or web address]

## 2019-07-13 ENCOUNTER — Telehealth: Payer: Self-pay | Admitting: *Deleted

## 2019-07-13 NOTE — Telephone Encounter (Signed)
Attempted call to patient to schedule bone marrow biopsy. nio answer but was able to leave vm message on identified phone . No answer when attempted call on home #

## 2019-07-13 NOTE — Telephone Encounter (Signed)
-----  Message from Orson Slick, MD sent at 07/13/2019  9:48 AM EDT ----- This patient is in need of a bone marrow biopsy. I have attempted to call her and have left messages requesting call back. She has not responded. Could you try to get a hold of her?  Best,  Ulice Dash

## 2019-07-16 NOTE — Telephone Encounter (Signed)
Received call back from patient. Transferred call to Dr. Lorenso Courier so he can review her recent lab results.

## 2019-07-20 ENCOUNTER — Telehealth: Payer: Self-pay | Admitting: *Deleted

## 2019-07-20 NOTE — Telephone Encounter (Signed)
Per request of Dr Lorenso Courier pt need bone marrow biopsy within 2 weeks.  Per [facility] out of office week of July 4 - appointment requested for 7/13 at 730 am per treatment room availability.  Flow cytometry notified and scheduled.  Urgent scheduling request sent per above.

## 2019-07-21 ENCOUNTER — Telehealth: Payer: Self-pay | Admitting: Hematology and Oncology

## 2019-07-21 NOTE — Telephone Encounter (Signed)
Called pt per 6/28 sch message- pt is aware of appts added and will keep it for not but is not sure she will keep it as the date get closer . She will use this time to decide.

## 2019-08-03 NOTE — Progress Notes (Deleted)
Called and talked to patient about bone marrow biopsy.  She wants to cancel her appointment for tomorrow and will call us back to reschedule.  I gave her information about the indications, risks and benefits of the procedure in detail.    Wilber Bihari, NP

## 2019-08-04 ENCOUNTER — Other Ambulatory Visit: Payer: Medicare Other

## 2019-08-11 ENCOUNTER — Telehealth: Payer: Self-pay | Admitting: Hematology and Oncology

## 2019-08-11 NOTE — Telephone Encounter (Signed)
Scheduled appts per 7/19 sch msg. Left voicemail with appt date and time.

## 2019-11-20 ENCOUNTER — Other Ambulatory Visit: Payer: Self-pay | Admitting: Family Medicine

## 2019-11-20 DIAGNOSIS — Z1231 Encounter for screening mammogram for malignant neoplasm of breast: Secondary | ICD-10-CM

## 2019-12-31 ENCOUNTER — Other Ambulatory Visit: Payer: Self-pay

## 2019-12-31 ENCOUNTER — Ambulatory Visit
Admission: RE | Admit: 2019-12-31 | Discharge: 2019-12-31 | Disposition: A | Discharge status: Home / Self Care | Payer: Medicare Other | Source: Ambulatory Visit | Attending: Family Medicine | Admitting: Family Medicine

## 2019-12-31 DIAGNOSIS — Z1231 Encounter for screening mammogram for malignant neoplasm of breast: Secondary | ICD-10-CM

## 2020-01-05 ENCOUNTER — Other Ambulatory Visit: Payer: Self-pay | Admitting: Family Medicine

## 2020-01-05 DIAGNOSIS — R928 Other abnormal and inconclusive findings on diagnostic imaging of breast: Secondary | ICD-10-CM

## 2020-01-19 ENCOUNTER — Other Ambulatory Visit: Payer: Medicare Other

## 2020-01-27 ENCOUNTER — Other Ambulatory Visit (HOSPITAL_COMMUNITY): Payer: Self-pay | Admitting: Nephrology

## 2020-01-27 ENCOUNTER — Other Ambulatory Visit: Payer: Self-pay | Admitting: Nephrology

## 2020-01-27 DIAGNOSIS — R809 Proteinuria, unspecified: Secondary | ICD-10-CM

## 2020-02-05 ENCOUNTER — Other Ambulatory Visit: Payer: Medicare Other

## 2020-02-05 ENCOUNTER — Ambulatory Visit
Admission: RE | Admit: 2020-02-05 | Discharge: 2020-02-05 | Disposition: A | Discharge status: Home / Self Care | Payer: Medicare Other | Source: Ambulatory Visit | Attending: Family Medicine | Admitting: Family Medicine

## 2020-02-05 ENCOUNTER — Other Ambulatory Visit: Payer: Self-pay

## 2020-02-05 DIAGNOSIS — R928 Other abnormal and inconclusive findings on diagnostic imaging of breast: Secondary | ICD-10-CM

## 2020-02-09 ENCOUNTER — Encounter (HOSPITAL_COMMUNITY): Payer: Self-pay

## 2020-02-09 ENCOUNTER — Ambulatory Visit (HOSPITAL_COMMUNITY): Admission: RE | Admit: 2020-02-09 | Payer: Medicare Other | Source: Ambulatory Visit

## 2020-02-11 ENCOUNTER — Other Ambulatory Visit: Payer: Self-pay | Admitting: Hematology and Oncology

## 2020-02-11 ENCOUNTER — Inpatient Hospital Stay: Payer: Medicare Other | Attending: Hematology and Oncology | Admitting: Hematology and Oncology

## 2020-02-11 ENCOUNTER — Inpatient Hospital Stay: Payer: Medicare Other

## 2020-02-11 DIAGNOSIS — D472 Monoclonal gammopathy: Secondary | ICD-10-CM

## 2020-02-12 ENCOUNTER — Telehealth: Payer: Self-pay | Admitting: Hematology and Oncology

## 2020-02-12 NOTE — Telephone Encounter (Signed)
Called pt per 1/20 sch msg - no answer and no vmail. Sent SMS notification

## 2020-02-15 ENCOUNTER — Other Ambulatory Visit: Payer: Self-pay | Admitting: Radiology

## 2020-02-16 ENCOUNTER — Other Ambulatory Visit: Payer: Self-pay | Admitting: Physician Assistant

## 2020-02-16 ENCOUNTER — Other Ambulatory Visit: Payer: Self-pay | Admitting: Radiology

## 2020-02-17 ENCOUNTER — Other Ambulatory Visit: Payer: Self-pay

## 2020-02-17 ENCOUNTER — Ambulatory Visit (HOSPITAL_COMMUNITY)
Admission: RE | Admit: 2020-02-17 | Discharge: 2020-02-17 | Disposition: A | Discharge status: Home / Self Care | Payer: Medicare Other | Source: Ambulatory Visit | Attending: Nephrology | Admitting: Nephrology

## 2020-02-17 ENCOUNTER — Ambulatory Visit (HOSPITAL_COMMUNITY)
Admission: RE | Admit: 2020-02-17 | Discharge: 2020-02-17 | Disposition: A | Discharge status: Home / Self Care | Payer: Medicare Other | Source: Ambulatory Visit | Attending: Radiology | Admitting: Radiology

## 2020-02-17 ENCOUNTER — Encounter (HOSPITAL_COMMUNITY): Payer: Self-pay

## 2020-02-17 DIAGNOSIS — R509 Fever, unspecified: Secondary | ICD-10-CM | POA: Diagnosis present

## 2020-02-17 DIAGNOSIS — J90 Pleural effusion, not elsewhere classified: Secondary | ICD-10-CM | POA: Diagnosis not present

## 2020-02-17 DIAGNOSIS — R809 Proteinuria, unspecified: Secondary | ICD-10-CM | POA: Diagnosis present

## 2020-02-17 DIAGNOSIS — R059 Cough, unspecified: Secondary | ICD-10-CM | POA: Insufficient documentation

## 2020-02-17 DIAGNOSIS — U071 COVID-19: Secondary | ICD-10-CM | POA: Diagnosis not present

## 2020-02-17 DIAGNOSIS — I517 Cardiomegaly: Secondary | ICD-10-CM | POA: Diagnosis not present

## 2020-02-17 LAB — SARS CORONAVIRUS 2 BY RT PCR (HOSPITAL ORDER, PERFORMED IN ~~LOC~~ HOSPITAL LAB): SARS Coronavirus 2: POSITIVE — AB

## 2020-02-17 MED ORDER — SODIUM CHLORIDE 0.9 % IV SOLN: INTRAVENOUS | Status: DC

## 2020-02-17 NOTE — Progress Notes (Signed)
Pt procedure canceled today.  Pt given instructions by PA and verbalized understanding.  Pt d/c ambulatory

## 2020-02-17 NOTE — Progress Notes (Signed)
Patient ID: Kirsten Cabrera, female   DOB: Nov 19, 1950, 70 y.o.   MRN: [id number] Pt presented to [facility] today for image guided random renal biopsy. On presentation, she had temp of 100.2 and productive cough.No HA,CP, chills, sore throat. CXR revealed: Cardiomegaly with pulmonary venous congestion, bilateral interstitial prominence, and small right pleural effusion. Findings suggest CHF. Pneumonitis cannot be excluded. Pt tested positive for COVID-19 today.  Findings d/w Dr. Laurence Ferrari and decision made to reschedule renal bx for week after next. Pt informed of positive COVID test. Pt was instructed to quarantine for recommended period of time at home and contact primary care MD with current status. Nurse aware.

## 2020-02-17 NOTE — Progress Notes (Signed)
Pt oral temp 100.2 pt reports hx COPD, smoking, cough.  Cough has become more productive recently.  Denies headache, chills, N/V, sore throat.  K Allred, PA paged.  Will continue to monitor  Updated [place] in radiology of pt status

## 2020-02-18 ENCOUNTER — Telehealth: Payer: Self-pay

## 2020-02-18 NOTE — Telephone Encounter (Signed)
Called to discuss with patient about COVID-19 symptoms and the use of one of the available treatments for those with mild to moderate Covid symptoms and at a high risk of hospitalization.  Pt appears to qualify for outpatient treatment due to co-morbid conditions and/or a member of an at-risk group in accordance with the FDA Emergency Use Authorization.    Symptom onset: Unknown Vaccinated: Unknown Booster? Unknown Immunocompromised? Unknown Qualifiers: HTN  Unable to reach pt - Voice mailbox not set up, unable to leave message.  Marcello Moores

## 2020-03-10 ENCOUNTER — Telehealth (HOSPITAL_COMMUNITY): Payer: Self-pay

## 2020-03-14 ENCOUNTER — Telehealth (HOSPITAL_COMMUNITY): Payer: Self-pay

## 2020-04-15 ENCOUNTER — Other Ambulatory Visit: Payer: Self-pay | Admitting: Family Medicine

## 2020-04-15 DIAGNOSIS — R590 Localized enlarged lymph nodes: Secondary | ICD-10-CM

## 2020-05-05 ENCOUNTER — Ambulatory Visit
Admission: RE | Admit: 2020-05-05 | Discharge: 2020-05-05 | Disposition: A | Discharge status: Home / Self Care | Payer: Medicare Other | Source: Ambulatory Visit | Attending: Family Medicine | Admitting: Family Medicine

## 2020-05-05 ENCOUNTER — Other Ambulatory Visit: Payer: Self-pay | Admitting: Family Medicine

## 2020-05-05 DIAGNOSIS — R590 Localized enlarged lymph nodes: Secondary | ICD-10-CM

## 2020-06-02 ENCOUNTER — Other Ambulatory Visit: Payer: Self-pay

## 2020-06-02 ENCOUNTER — Emergency Department (HOSPITAL_COMMUNITY)
Admission: EM | Admit: 2020-06-02 | Discharge: 2020-06-03 | Disposition: A | Discharge status: Home / Self Care | Payer: Medicare Other | Attending: Emergency Medicine | Admitting: Emergency Medicine

## 2020-06-02 ENCOUNTER — Emergency Department (HOSPITAL_COMMUNITY): Payer: Medicare Other

## 2020-06-02 DIAGNOSIS — Z7951 Long term (current) use of inhaled steroids: Secondary | ICD-10-CM | POA: Insufficient documentation

## 2020-06-02 DIAGNOSIS — J449 Chronic obstructive pulmonary disease, unspecified: Secondary | ICD-10-CM | POA: Insufficient documentation

## 2020-06-02 DIAGNOSIS — Z79899 Other long term (current) drug therapy: Secondary | ICD-10-CM | POA: Insufficient documentation

## 2020-06-02 DIAGNOSIS — F17210 Nicotine dependence, cigarettes, uncomplicated: Secondary | ICD-10-CM | POA: Insufficient documentation

## 2020-06-02 DIAGNOSIS — I10 Essential (primary) hypertension: Secondary | ICD-10-CM | POA: Insufficient documentation

## 2020-06-02 DIAGNOSIS — R519 Headache, unspecified: Secondary | ICD-10-CM | POA: Diagnosis present

## 2020-06-02 LAB — BASIC METABOLIC PANEL
Anion gap: 8 (ref 5–15)
BUN: 14 mg/dL (ref 8–23)
CO2: 28 mmol/L (ref 22–32)
Calcium: 9.6 mg/dL (ref 8.9–10.3)
Chloride: 101 mmol/L (ref 98–111)
Creatinine, Ser: 0.94 mg/dL (ref 0.44–1.00)
GFR, Estimated: >60 mL/min (ref >60)
Glucose, Bld: 89 mg/dL (ref 70–99)
Potassium: 3.7 mmol/L (ref 3.5–5.1)
Sodium: 137 mmol/L (ref 135–145)

## 2020-06-02 LAB — CBC WITH DIFFERENTIAL/PLATELET
Abs Immature Granulocytes: 0.02 10*3/uL (ref 0.00–0.07)
Basophils Absolute: 0.1 10*3/uL (ref 0.0–0.1)
Basophils Relative: 1 %
Eosinophils Absolute: 0.4 10*3/uL (ref 0.0–0.5)
Eosinophils Relative: 6 %
HCT: 37.7 % (ref 36.0–46.0)
Hemoglobin: 12.6 g/dL (ref 12.0–15.0)
Immature Granulocytes: 0 %
Lymphocytes Relative: 26 %
Lymphs Abs: 1.8 10*3/uL (ref 0.7–4.0)
MCH: 34.7 pg — ABNORMAL HIGH (ref 26.0–34.0)
MCHC: 33.4 g/dL (ref 30.0–36.0)
MCV: 103.9 fL — ABNORMAL HIGH (ref 80.0–100.0)
Monocytes Absolute: 0.9 10*3/uL (ref 0.1–1.0)
Monocytes Relative: 13 %
Neutro Abs: 4 10*3/uL (ref 1.7–7.7)
Neutrophils Relative %: 54 %
Platelets: 258 10*3/uL (ref 150–400)
RBC: 3.63 MIL/uL — ABNORMAL LOW (ref 3.87–5.11)
RDW: 14.6 % (ref 11.5–15.5)
WBC: 7.2 10*3/uL (ref 4.0–10.5)
nRBC: 0 % (ref 0.0–0.2)

## 2020-06-02 LAB — URINALYSIS, ROUTINE W REFLEX MICROSCOPIC
Bilirubin Urine: NEGATIVE
Glucose, UA: NEGATIVE mg/dL
Hgb urine dipstick: NEGATIVE
Ketones, ur: NEGATIVE mg/dL
Leukocytes,Ua: NEGATIVE
Nitrite: NEGATIVE
Protein, ur: NEGATIVE mg/dL
Specific Gravity, Urine: 1.008 (ref 1.005–1.030)
pH: 7 (ref 5.0–8.0)

## 2020-06-02 LAB — TROPONIN I (HIGH SENSITIVITY): Troponin I (High Sensitivity): 7 ng/L (ref <18)

## 2020-06-02 NOTE — ED Notes (Signed)
No answer for vitals X2

## 2020-06-02 NOTE — ED Triage Notes (Signed)
Pt bib [facility] for headache, dizziness, numbness and tingling in left extremity. Episode has since resolved. Pt c/o high blood pressure. Pts medications recently adjusted for same. Pt has tingling in back of right leg currently.

## 2020-06-02 NOTE — ED Provider Notes (Signed)
Emergency Medicine Provider Triage Evaluation Note  Kirsten Cabrera , a 70 y.o. female  was evaluated in triage.  Pt complains of dizziness/lightheadedness/near syncope. When this occurred she had some tingling to the ble and left arm which is not improved. She took her bp and it was high. Reports mild headache. Denies chest pain.   Review of Systems  Positive: Dizziness/lightheadedness/near syncope, headache Negative: Chest pain  Physical Exam  BP (!) 178/76 (BP Location: Right Arm)   Pulse 71   Temp 98.4 F (36.9 C) (Oral)   Resp 16   SpO2 94%  Gen:   Awake, no distress   Resp:  Normal effort  MSK:   Moves extremities without difficulty  Other:  Cranial nerves II-XII intact. No focal neuro deficit.  Medical Decision Making  Medically screening exam initiated at 9:26 PM.  Appropriate orders placed.  Kirsten Cabrera was informed that the remainder of the evaluation will be completed by another provider, this initial triage assessment does not replace that evaluation, and the importance of remaining in the ED until their evaluation is complete.     Kirsten Cabrera 06/02/20 2132    Kirsten Biles, MD 06/03/20 1525

## 2020-06-02 NOTE — ED Notes (Signed)
Pt called for VS, no response x3

## 2020-06-03 ENCOUNTER — Emergency Department (HOSPITAL_COMMUNITY): Payer: Medicare Other

## 2020-06-03 LAB — TROPONIN I (HIGH SENSITIVITY): Troponin I (High Sensitivity): 7 ng/L (ref <18)

## 2020-06-03 MED ORDER — LORAZEPAM 2 MG/ML IJ SOLN
0.5000 mg | Freq: Once | INTRAMUSCULAR | Status: AC
  Administered 2020-06-03: 0.5 mg via INTRAVENOUS
  Filled 2020-06-03: qty 1

## 2020-06-03 MED ORDER — LOSARTAN POTASSIUM 50 MG PO TABS
100.0000 mg | ORAL_TABLET | Freq: Every day | ORAL | Status: DC
  Administered 2020-06-03: 100 mg via ORAL
  Filled 2020-06-03: qty 2

## 2020-06-03 MED ORDER — METOPROLOL SUCCINATE ER 25 MG PO TB24
100.0000 mg | ORAL_TABLET | Freq: Every day | ORAL | Status: DC
  Administered 2020-06-03: 100 mg via ORAL
  Filled 2020-06-03: qty 4

## 2020-06-03 NOTE — ED Notes (Signed)
Patient 90% on room air. States she is comfortable at this time.

## 2020-06-03 NOTE — Discharge Instructions (Signed)
Try decreasing the amount of salt you are eating and continue taking the medications you are on right now however your doctor may need to adjust or change the medicine because it does not seem to be controlling your blood pressure well.  The MRI today showed no signs of stroke.  However if you develop inability to move 1 side of your body, difficulty speaking, any passing out, chest pain or other concerns you should return to the emergency room.

## 2020-06-03 NOTE — ED Notes (Signed)
Purewick external catheter applied to suction

## 2020-06-03 NOTE — ED Provider Notes (Signed)
[facility] EMERGENCY DEPARTMENT Provider Note   CSN: [id number] Arrival date & time: 06/02/20  2034     History Chief Complaint  Patient presents with  . Headache    Kirsten Cabrera is a 70 y.o. female.  Patient is a 70 year old female with a history of COPD, ongoing tobacco use, hypertension who is presenting today with several complaints.  Patient reports that yesterday around 2:00 she was making sloppy Joe's for her grandson for when he would get out of school and she started feeling strange.  She felt dizzy like she was going to tip over and was not herself.  Shortly after that she noticed that she was starting to get a slight headache and took 3 Tylenol.  She then sat down and started checking her blood pressure and it was elevated at 190s over 100s which she states is significantly higher than her baseline.  2 months ago she increased her blood pressure medication losartan 100 mg and metoprolol 100 mg daily but blood pressure had still been running in the [id number] systolic but the bottom numbers were usually in the 70s.  She waited for several hours and checked her blood pressure several more times and it was elevated the entire time.  She then started noticing some mild tingling in her left hand but not necessarily in her leg or her face.  She spoke with a provider on the phone who recommended she call 911.  Unfortunately patient has been waiting now for 12 hours but she did get up 2 times to go to the bathroom while she was in the waiting room and reports that the feeling like she is going to tip over has gone away.  She was able to ambulate with no difficulty.  However she still has some mild tingling in her left hand.  She denies any headache at this time.  She has not taken any medications this morning but did take her blood pressure medications yesterday and then took a half of tablet yesterday afternoon of both medicines.  She has not had recent cough,  congestion, fever, abdominal pain, chest pain.  She does get occasional tightness in her chest related to her COPD and has been using her inhaler intermittently.  She reports this is baseline for her.  No prior history of stroke.  The history is provided by the patient.  Headache      Past Medical History:  Diagnosis Date  . Arthritis   . COPD (chronic obstructive pulmonary disease) ([place])   . Depression   . Dyspnea    with exertion  . Hypertension   . Prolapsed internal hemorrhoids, grade 4   . Tobacco abuse     Patient Active Problem List   Diagnosis Date Noted  . Prolapsed hemorrhoid status post left lateral hemorrhoid ligation/pexy/hemorrhoidectomy 04/19/2017 04/19/2017  . 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 Cyst of perianal area s/p excision 04/19/2017 04/19/2017    Past Surgical History:  Procedure Laterality Date  . BACK SURGERY  2001   lumbra L4-S1  . BREAST SURGERY     right breast biopsy  . clitoris biopsy  2018  . EYE SURGERY     bilateral cataract surgery  . HEMORRHOID SURGERY  2015  . HEMORRHOID SURGERY N/A 04/19/2017   Procedure: HEMORRHOIDECTOMY WITH HEMORRHOIDAL LIGATION/PEXY;  Surgeon: Michael Boston, MD;  Location: [facility] ORS;  Service: General;  Laterality: N/A;  . TUBAL LIGATION     age 52  OB History   No obstetric history on file.     Family History  Problem Relation Age of Onset  . Heart disease Mother   . Heart disease Daughter   . Heart disease Daughter     Social History   Tobacco Use  . Smoking status: Current Every Day Smoker    Packs/day: 0.25    Years: 20.00    Pack years: 5.00    Types: Cigarettes  . Smokeless tobacco: Never Used  Vaping Use  . Vaping Use: Never used  Substance Use Topics  . Alcohol use: Yes    Comment: occassionally  . Drug use: Never    Home Medications Prior to Admission medications   Medication Sig Start Date End Date Taking? Authorizing Provider   acetaminophen (TYLENOL) 500 MG tablet Take 1,000-1,500 mg by mouth every 8 (eight) hours as needed for moderate pain or headache.    [provider]  albuterol (PROVENTIL HFA;VENTOLIN HFA) 108 (90 Base) MCG/ACT inhaler Inhale 2 puffs into the lungs every 4 (four) hours as needed for wheezing or shortness of breath.    [provider]  albuterol (PROVENTIL) (2.5 MG/3ML) 0.083% nebulizer solution Take 2.5 mg by nebulization 3 (three) times daily as needed for wheezing or shortness of breath.    [provider]  Ascorbic Acid (VITAMIN C) 1000 MG tablet Take 1,000 mg by mouth daily.    [provider]  bismuth subsalicylate (PEPTO BISMOL) 262 MG/15ML suspension Take 30 mLs by mouth every 6 (six) hours as needed for indigestion.    [provider]  brinzolamide (AZOPT) 1 % ophthalmic suspension Place 1 drop into the right eye 2 (two) times daily.    [provider]  budesonide-formoterol (SYMBICORT) 160-4.5 MCG/ACT inhaler Inhale 2 puffs into the lungs 2 (two) times daily.    [provider]  calcium carbonate (TUMS - DOSED IN MG ELEMENTAL CALCIUM) 500 MG chewable tablet Chew 1,000-1,500 mg by mouth daily as needed for indigestion or heartburn.    [provider]  Cyanocobalamin (B-12) 2500 MCG TABS Take 2,500 mcg by mouth daily.    [provider]  dextromethorphan-guaiFENesin (MUCINEX DM) 30-600 MG 12hr tablet Take 1 tablet by mouth 2 (two) times daily as needed (congestion).    [provider]  diphenhydrAMINE (BENADRYL) 25 MG tablet Take 25 mg by mouth daily as needed for allergies.    [provider]  hydroxychloroquine (PLAQUENIL) 200 MG tablet Take 200 mg by mouth 2 (two) times daily.    [provider]  loratadine (CLARITIN) 10 MG tablet Take 10 mg by mouth daily as needed for allergies.    [provider]  losartan (COZAAR) 50 MG tablet Take 50 mg by mouth daily.    [provider]  metoprolol succinate (TOPROL-XL) 25 MG 24 hr tablet Take 25 mg by mouth daily.    [provider]  Multiple Vitamin (MULTIVITAMIN) tablet Take 1 tablet by mouth daily.    [provider]  Simethicone (GAS-X PO) Take 1-2 tablets by mouth daily as needed (gas).    [provider]  sodium chloride (OCEAN) 0.65 % SOLN nasal spray Place 1 spray into both nostrils as needed for congestion.    [provider]  triamcinolone (KENALOG) 0.1 % Apply 1 application topically daily.    [provider]  valACYclovir (VALTREX) 500 MG tablet Take 500 mg by mouth 2 (two) times daily.    [provider]  Allergies    Levofloxacin, Iohexol, Sulfa antibiotics, and Penicillins  Review of Systems   Review of Systems  Neurological: Positive for headaches.  All other systems reviewed and are negative.   Physical Exam Updated Vital Signs BP (!) 196/152   Pulse 67   Temp 98.4 F (36.9 C) (Oral)   Resp 16   SpO2 94%   Physical Exam Vitals and nursing note reviewed.  Constitutional:      General: She is not in acute distress.    Appearance: Normal appearance. She is well-developed and normal weight.  HENT:     Head: Normocephalic and atraumatic.     Right Ear: Tympanic membrane normal.     Left Ear: Tympanic membrane normal.     Nose: Nose normal.     Mouth/Throat:     Mouth: Mucous membranes are moist.  Eyes:     Pupils: Pupils are equal, round, and reactive to light.  Cardiovascular:     Rate and Rhythm: Normal rate and regular rhythm.     Heart sounds: Normal heart sounds. No murmur heard. No friction rub.  Pulmonary:     Effort: Pulmonary effort is normal.     Breath sounds: Wheezing present. No rales.     Comments: Scant wheezing Abdominal:     General: Bowel sounds are normal. There is no distension.     Palpations: Abdomen is soft.     Tenderness: There is no abdominal tenderness. There is no guarding or rebound.   Musculoskeletal:        General: No tenderness. Normal range of motion.     Cervical back: Normal range of motion.     Comments: No edema  Skin:    General: Skin is warm and dry.     Findings: No rash.  Neurological:     Mental Status: She is alert and oriented to person, place, and time.     Cranial Nerves: No cranial nerve deficit.     Sensory: Sensation is intact.     Motor: Motor function is intact. No weakness or pronator drift.     Coordination: Coordination is intact. Heel to [facility] Test normal.     Gait: Gait is intact. Gait normal.     Comments: No aphasia.  No gross sensory deficits  Psychiatric:        Mood and Affect: Mood normal.        Behavior: Behavior normal.     ED Results / Procedures / Treatments   Labs (all labs ordered are listed, but only abnormal results are displayed) Labs Reviewed  CBC WITH DIFFERENTIAL/PLATELET - Abnormal; Notable for the following components:      Result Value   RBC 3.63 (*)    MCV 103.9 (*)    MCH 34.7 (*)    All other components within normal limits  URINALYSIS, ROUTINE W REFLEX MICROSCOPIC - Abnormal; Notable for the following components:   Color, Urine STRAW (*)    All other components within normal limits  BASIC METABOLIC PANEL  TROPONIN I (HIGH SENSITIVITY)  TROPONIN I (HIGH SENSITIVITY)    EKG EKG Interpretation  Date/Time:  Thursday Jun 02 2020 21:33:25 EDT Ventricular Rate:  77 PR Interval:  180 QRS Duration: 82 QT Interval:  402 QTC Calculation: 454 R Axis:   51 Text Interpretation: Normal sinus rhythm Possible Anterior infarct , age undetermined No significant change since last tracing Confirmed by Blanchie Dessert [phone]) on 06/03/2020 7:42:39 AM   Radiology DG Chest 2 View  Result Date: 06/02/2020 CLINICAL DATA:  Near syncope. EXAM: CHEST - 2 VIEW COMPARISON:  Radiograph 02/17/2020.  CT 05/05/2020 FINDINGS: Unchanged cardiomegaly. Diffuse aortic atherosclerosis and tortuosity. Chronic hyperinflation and  bronchial thickening. No acute airspace disease, pulmonary edema, pleural effusion or pneumothorax. Degenerative endplate spurring in the spine. IMPRESSION: 1. No acute abnormality. 2. Unchanged cardiomegaly. Chronic hyperinflation and bronchial thickening. Electronically Signed   By: Keith Rake M.D.   On: 06/02/2020 22:07   CT Head Wo Contrast  Result Date: 06/02/2020 CLINICAL DATA:  Headache.  Transient ischemic attack. EXAM: CT HEAD WITHOUT CONTRAST TECHNIQUE: Contiguous axial images were obtained from the base of the skull through the vertex without intravenous contrast. COMPARISON:  None. FINDINGS: Brain: No intracranial hemorrhage, mass effect, or midline shift. Brain volume is normal for age. No hydrocephalus. The basilar cisterns are patent. Mild periventricular white matter hypodensity nonspecific but typically chronic small vessel ischemia. Punctate lacunar infarcts in the bilateral basal ganglia. No evidence of territorial infarct or acute ischemia. No extra-axial or intracranial fluid collection. Vascular: Atherosclerosis of skullbase vasculature without hyperdense vessel or abnormal calcification. Skull: No fracture or focal lesion. Sinuses/Orbits: Paranasal sinuses and mastoid air cells are clear. The visualized orbits are unremarkable. Bilateral cataract resection. Other: None. IMPRESSION: 1. No acute intracranial abnormality. 2. Mild chronic small vessel ischemia. Punctate lacunar infarcts in the bilateral basal ganglia. Electronically Signed   By: Keith Rake M.D.   On: 06/02/2020 22:13   MR BRAIN WO CONTRAST  Result Date: 06/03/2020 CLINICAL DATA:  TIA, dizziness EXAM: MRI HEAD WITHOUT CONTRAST TECHNIQUE: Multiplanar, multiecho pulse sequences of the brain and surrounding structures were obtained without intravenous contrast. COMPARISON:  None. FINDINGS: Motion artifact is present. Brain: There is no acute infarction or intracranial hemorrhage. There is no intracranial mass, mass  effect, or edema. There is no hydrocephalus or extra-axial fluid collection. Patchy and confluent areas of T2 hyperintensity in the supratentorial and pontine white matter are nonspecific but probably reflect moderate chronic microvascular ischemic changes. There are chronic small vessel infarcts of the basal ganglia bilaterally and right thalamus. Small focus of susceptibility hypointensity in the left frontal white matter is most compatible with chronic microhemorrhage. Vascular: Major vessel flow voids at the skull base are preserved. Skull and upper cervical spine: Normal marrow signal is preserved. Sinuses/Orbits: Minor mucosal thickening. Bilateral lens replacements. Other: Sella is mildly expanded and partially empty. Mastoid air cells are clear. IMPRESSION: No evidence of recent infarction, hemorrhage, or mass. Moderate chronic microvascular ischemic changes. 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 Electronically Signed   By: Macy Mis M.D.   On: 06/03/2020 10:00    Procedures Procedures   Medications Ordered in ED Medications  losartan (COZAAR) tablet 100 mg (has no administration in time range)  metoprolol succinate (TOPROL-XL) 24 hr tablet 100 mg (has no administration in time range)    ED Course  I have reviewed the triage vital signs and the nursing notes.  Pertinent labs & imaging results that were available during my care of the patient were reviewed by me and considered in my medical decision making (see chart for details).    MDM Rules/Calculators/A&P                          Patient is a 70 year old female presenting today with symptoms of hypertension and possible strokelike symptoms.  Patient was feeling dizzy like she might tip over with headache and some tingling in  her left arm.  This was in the setting of her blood pressure being elevated in the [id number] systolic over the 123XX123 diastolic.  Patient has been taking her medications as prescribed.   She actually took half a tab extra of each without improvement yesterday.  She also reports significant stress lately and she may be eating too much salt.  However she had no chest pain, vision changes or new shortness of breath.  Patient always has baseline shortness of breath because of COPD and ongoing tobacco use.  She is mildly wheezing here but reports that it improves when she uses her inhaler.  No focal neurologic findings on exam currently however given patient's multiple risk factors of hypertension, tobacco use and age as well as a CT that has showed prior punctate Luking or infarcts in the bilateral basal ganglia we will do an MRI for further evaluation.  CT showed nothing acute, chest x-ray showed no acute findings and UA, troponin, CBC, BMP and EKG were all within normal limits.  Patient given a dose of her home blood pressure medications that she would normally take in the morning as she is currently still hypertensive.  MRI pending she was given 0.5 of Ativan due to claustrophobia.  10:54 AM Patient's MRI today is negative for acute infarct.  She otherwise appears to be at her baseline.  With her blood pressure medications blood pressure is between 0000000 systolic over [id number].  Discussed with the patient that she should decrease salt intake in her diet but also call her doctor as she may be need to be on a different regimen of medications.  Patient was slightly hypoxic after getting Ativan and going to MRI however she is awake alert and speaking.  She denies feeling any shortness of breath.  She is not wheezing at this time.  Oxygen removed and patient's O2 sat between 90 and 93% on room air which is most likely her baseline.  Feel that patient is stable for discharge home and follow-up with her doctor for ongoing management of her hypertension.  MDM Number of Diagnoses or Management Options   Amount and/or Complexity of Data Reviewed Clinical lab tests: ordered and reviewed Tests in the  radiology section of CPT: ordered and reviewed Tests in the medicine section of CPT: ordered and reviewed Independent visualization of images, tracings, or specimens: yes  Risk of Complications, Morbidity, and/or Mortality Presenting problems: moderate Diagnostic procedures: moderate Management options: moderate  Patient Progress Patient progress: improved    Final Clinical Impression(s) / ED Diagnoses Final diagnoses:  Hypertension, unspecified type    Rx / DC Orders ED Discharge Orders    None       Blanchie Dessert, MD 06/03/20 1057

## 2020-06-03 NOTE — ED Notes (Signed)
Patient returned from MRI.

## 2020-06-03 NOTE — ED Notes (Signed)
Patient transported to MRI 

## 2020-06-03 NOTE — ED Notes (Signed)
Patient SpO2 decreased to 83-85% on room air at rest. 1L [place] applied, SpO2 increased to 94%. Patient asymptomatic at this time.

## 2022-05-08 IMAGING — MG DIGITAL SCREENING BILAT W/ TOMO W/ CAD
6 of 10 series · 6 of 30 positions shown · non-contrast
Comparison: Previous exam(s).

CLINICAL DATA: Screening.

EXAM:
DIGITAL SCREENING BILATERAL MAMMOGRAM WITH TOMO AND CAD

[L MLO synth-2D]
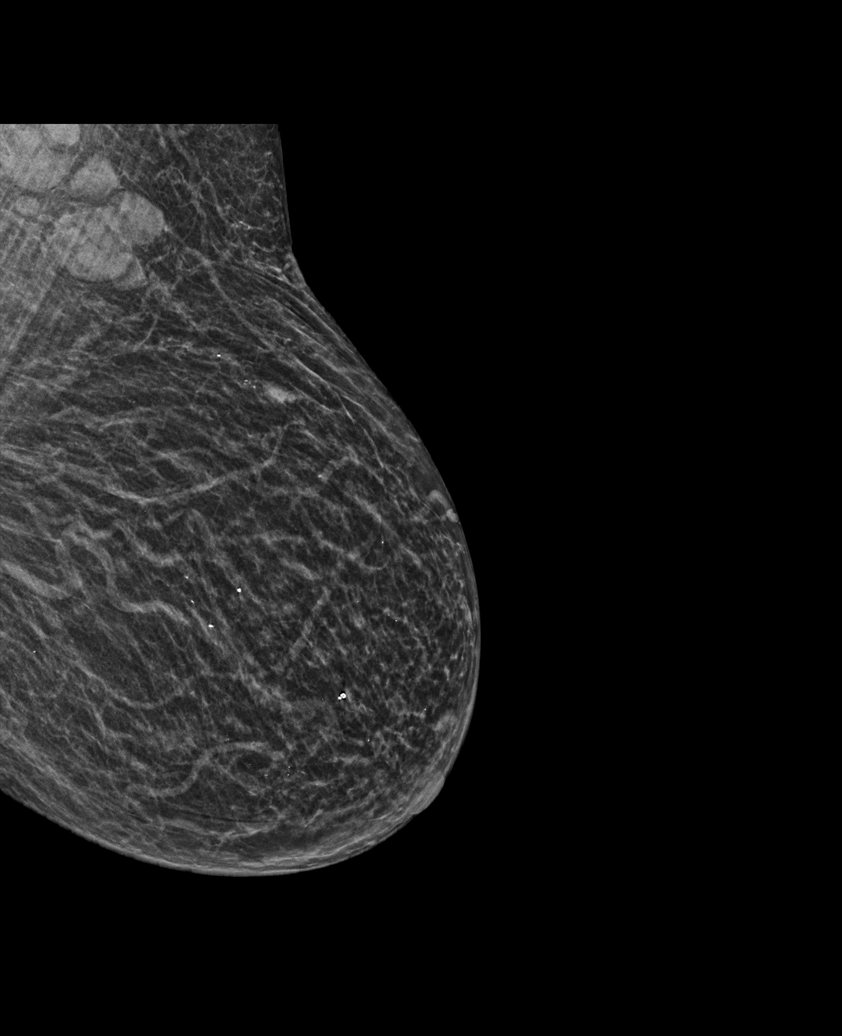

[R CC synth-2D]
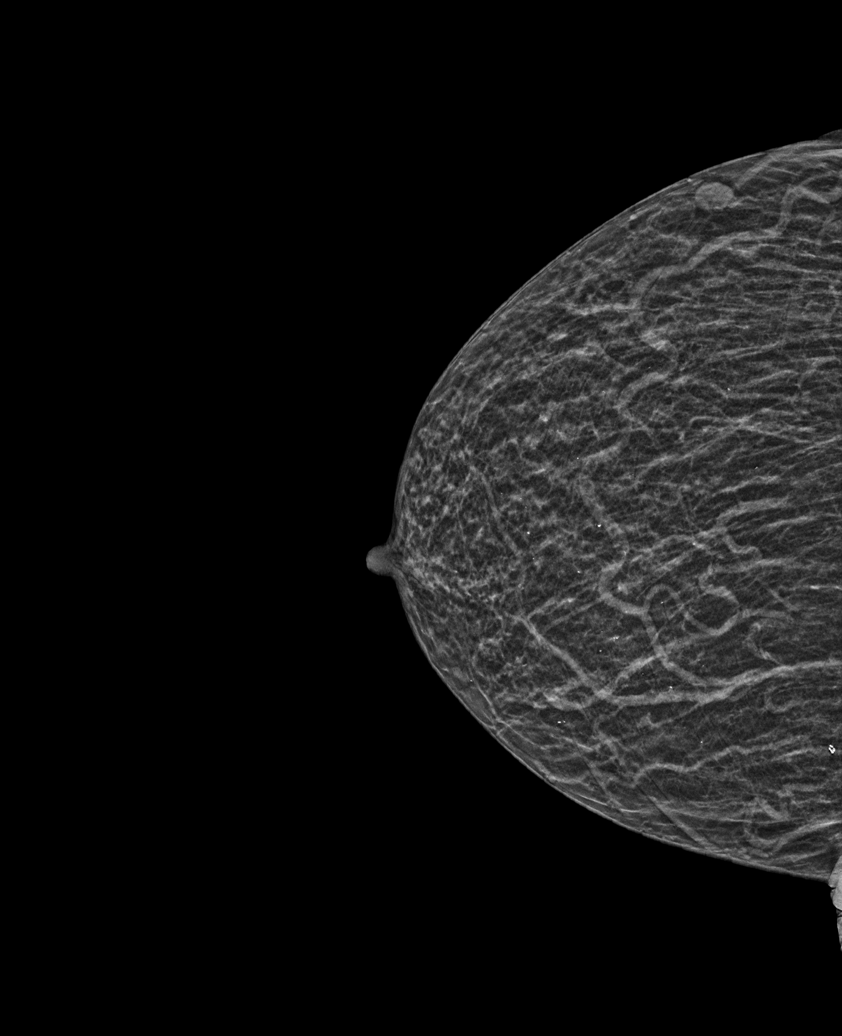

[L CC synth-2D]
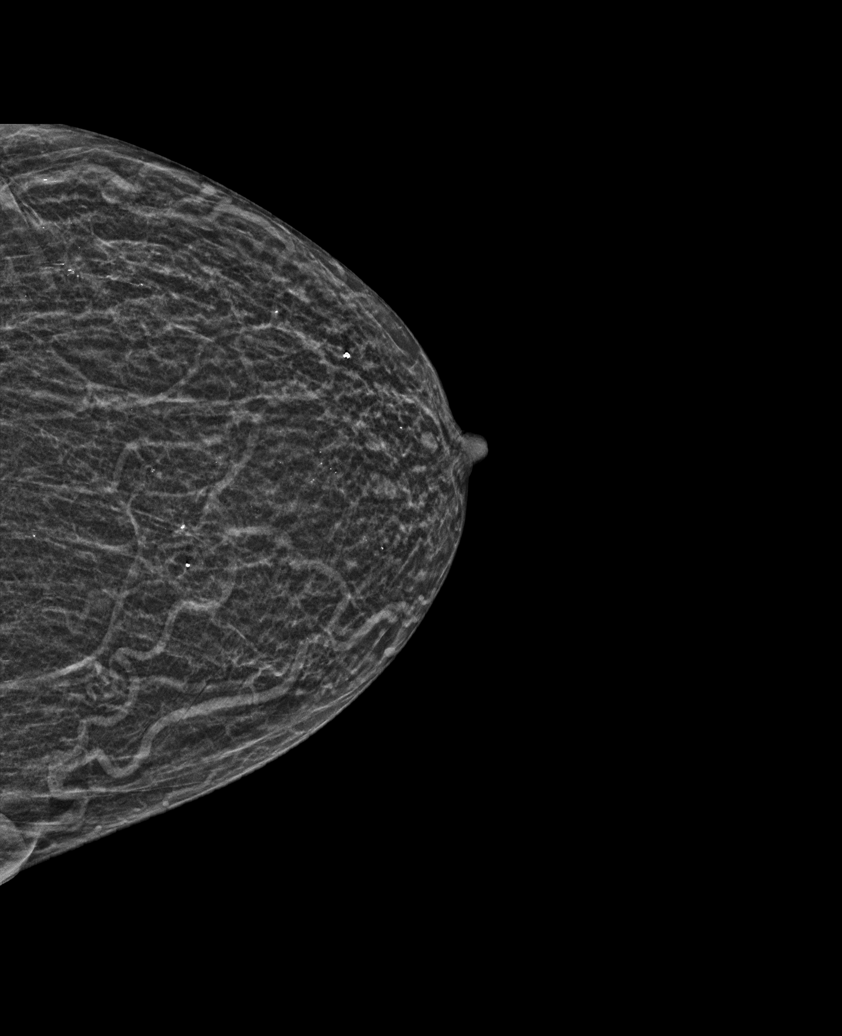

[R MLO synth-2D (1 of 2)]
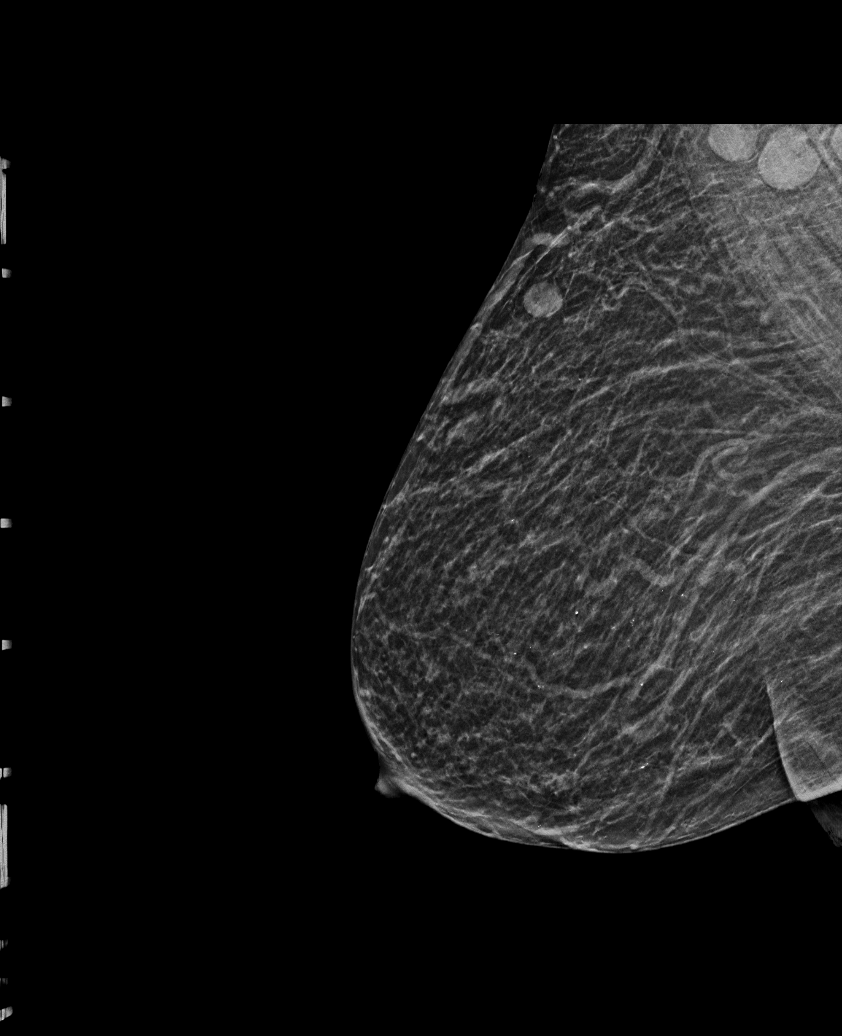

[R MLO synth-2D (2 of 2)]
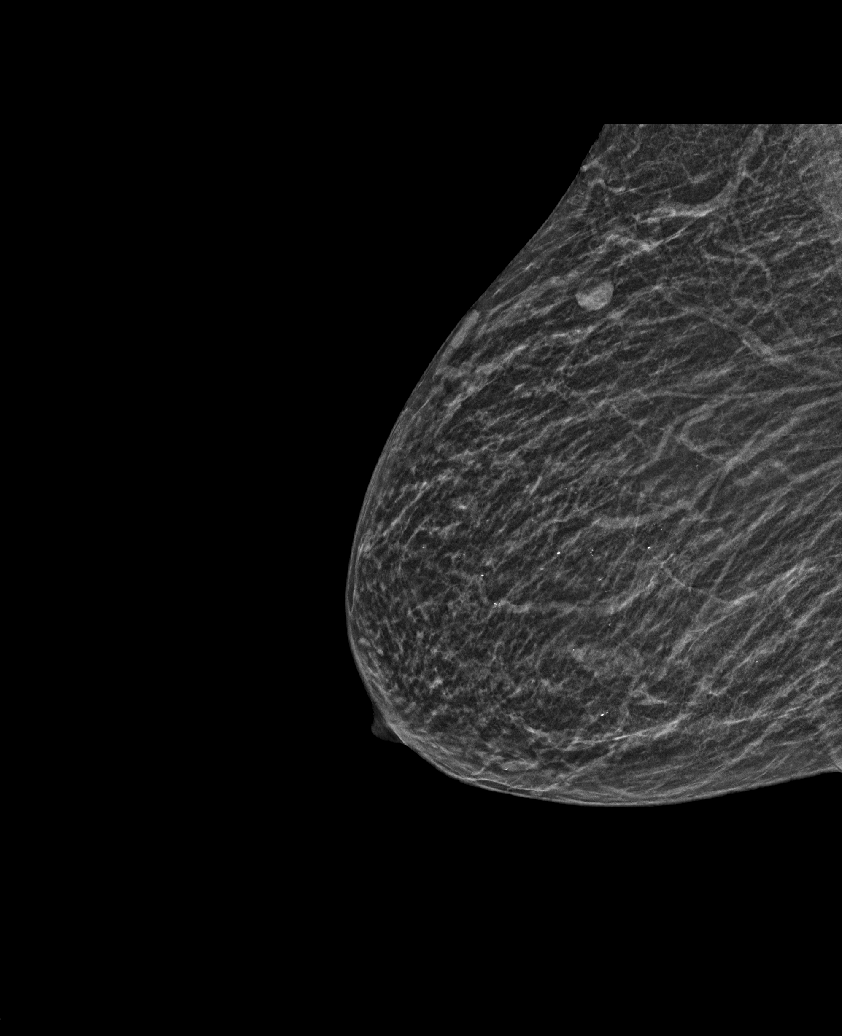

[L MLO tomo · tomo slice 21/41.0]
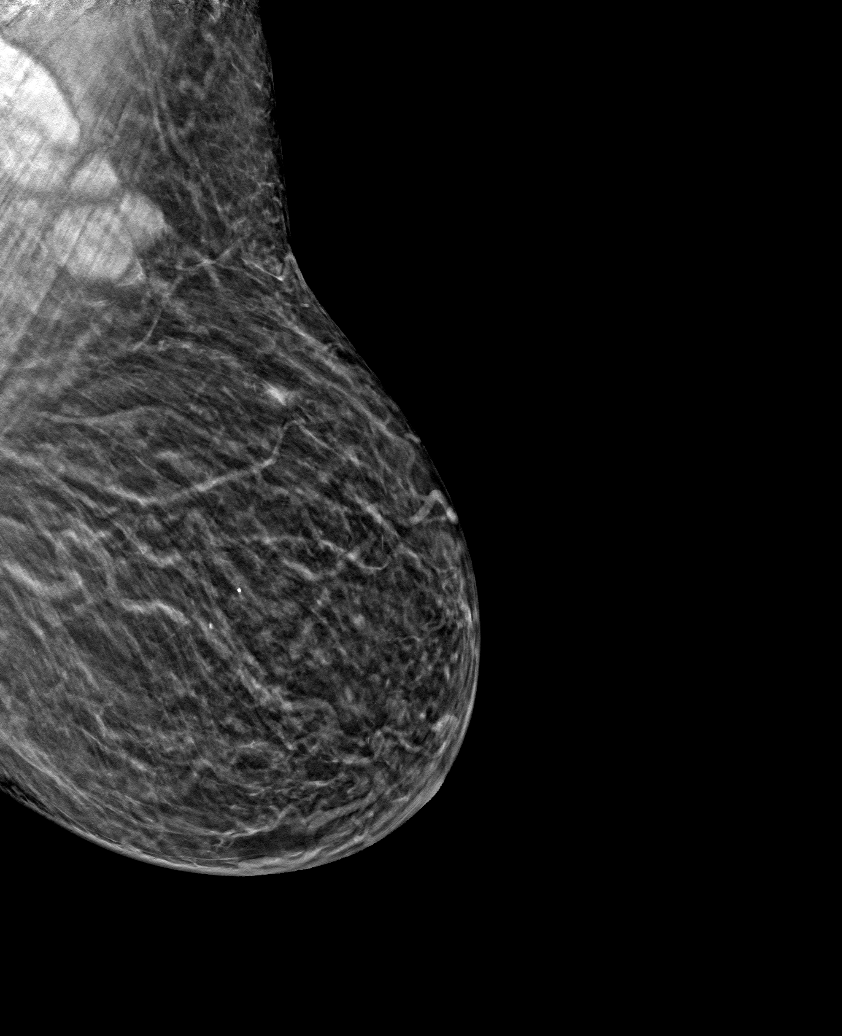

[6 of 30 positions shown; findings below may reference images not displayed]

ACR Breast Density Category b: There are scattered areas of
fibroglandular density.
FINDINGS: In the right and left axilla, possible enlarged lymph nodes warrant
further evaluation. Images were processed with CAD.
IMPRESSION: Further evaluation is suggested for possible enlarged nodes.

RECOMMENDATION:
Ultrasound of the right and left axilla.

The patient will be contacted regarding the findings, and additional
imaging will be scheduled.

BI-RADS CATEGORY  0: Incomplete. Need additional imaging evaluation
and/or prior mammograms for comparison.
